# Patient Record
Sex: Male | Born: 1990
Health system: Southern US, Community
[De-identification: ages and names within clinical notes are randomized; demographics above are authoritative.]

## PROBLEM LIST (undated history)

## (undated) DIAGNOSIS — L0501 Pilonidal cyst with abscess: Secondary | ICD-10-CM

## (undated) DIAGNOSIS — Z87898 Personal history of other specified conditions: Secondary | ICD-10-CM

## (undated) DIAGNOSIS — I456 Pre-excitation syndrome: Secondary | ICD-10-CM

## (undated) DIAGNOSIS — T7840XA Allergy, unspecified, initial encounter: Secondary | ICD-10-CM

## (undated) DIAGNOSIS — F909 Attention-deficit hyperactivity disorder, unspecified type: Secondary | ICD-10-CM

## (undated) DIAGNOSIS — E119 Type 2 diabetes mellitus without complications: Secondary | ICD-10-CM

## (undated) HISTORY — DX: Personal history of other specified conditions: Z87.898

## (undated) HISTORY — DX: Pilonidal cyst with abscess: L05.01

## (undated) HISTORY — DX: Type 2 diabetes mellitus without complications: E11.9

## (undated) HISTORY — DX: Allergy, unspecified, initial encounter: T78.40XA

## (undated) HISTORY — DX: Pre-excitation syndrome: I45.6

## (undated) HISTORY — DX: Attention-deficit hyperactivity disorder, unspecified type: F90.9

---
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
## 2009-02-08 ENCOUNTER — Emergency Department (HOSPITAL_COMMUNITY): Admission: EM | Admit: 2009-02-08 | Discharge: 2009-02-08 | Payer: Self-pay | Admitting: Emergency Medicine

## 2009-03-16 ENCOUNTER — Ambulatory Visit: Payer: Self-pay | Admitting: Internal Medicine

## 2009-03-31 ENCOUNTER — Encounter: Payer: Self-pay | Admitting: Internal Medicine

## 2009-04-01 ENCOUNTER — Encounter: Payer: Self-pay | Admitting: *Deleted

## 2009-04-20 ENCOUNTER — Telehealth (INDEPENDENT_AMBULATORY_CARE_PROVIDER_SITE_OTHER): Payer: Self-pay | Admitting: *Deleted

## 2009-04-20 ENCOUNTER — Telehealth: Payer: Self-pay | Admitting: Internal Medicine

## 2009-04-21 ENCOUNTER — Encounter: Payer: Self-pay | Admitting: Internal Medicine

## 2009-05-11 ENCOUNTER — Ambulatory Visit: Payer: Self-pay | Admitting: Internal Medicine

## 2009-05-21 ENCOUNTER — Telehealth: Payer: Self-pay | Admitting: Internal Medicine

## 2009-10-22 ENCOUNTER — Ambulatory Visit: Payer: Self-pay | Admitting: Internal Medicine

## 2010-02-28 ENCOUNTER — Ambulatory Visit: Payer: Self-pay | Admitting: Internal Medicine

## 2010-02-28 DIAGNOSIS — R209 Unspecified disturbances of skin sensation: Secondary | ICD-10-CM | POA: Insufficient documentation

## 2010-12-15 ENCOUNTER — Ambulatory Visit
Admission: RE | Admit: 2010-12-15 | Discharge: 2010-12-15 | Payer: Self-pay | Source: Home / Self Care | Attending: Internal Medicine | Admitting: Internal Medicine

## 2010-12-28 ENCOUNTER — Telehealth: Payer: Self-pay | Admitting: Internal Medicine

## 2011-01-12 NOTE — Progress Notes (Signed)
Summary: Side Effects on Strattera  Phone Note Call from Patient Call back at [phone]   Caller: Patient Summary of Call: Pt called saying that he wants to discuss Straterra. I spoke to pt and he took 1 pill and he was fatigue and felt druged up. Pt doesn't want to take another rx. Pt wants to try Vyvanse or Adderall. Initial call taken by: Romualdo Bolk, CMA (AAMA),  December 28, 2010 3:24 PM  Follow-up for Phone Call        Pt called back and wants to know status of med req change. Pls call back asap.  Follow-up by: Lucy Antigua,  December 29, 2010 3:01 PM  Additional Follow-up for Phone Call Additional follow up Details #1::        I think it is ok to try vyvanse   I had a note to Dr Graciela Husbands but Mickle Plumb had a full answer yet.  but I think ok to do.I can write an rx   vyvanse  20 mg 1 by mouth once daily and with coupon and then rov  in 3-4 weeks on med  Additional Follow-up by: Madelin Headings MD,  December 30, 2010 12:57 PM    Additional Follow-up for Phone Call Additional follow up Details #2::    pt aware rx up front ready for p/u, pt aware Follow-up by: Alfred Levins, CMA,  December 30, 2010 5:10 PM  New/Updated Medications: VYVANSE 20 MG CAPS (LISDEXAMFETAMINE DIMESYLATE) 1 by mouth once daily Prescriptions: VYVANSE 20 MG CAPS (LISDEXAMFETAMINE DIMESYLATE) 1 by mouth once daily  #30 x 0   Entered by:   Madelin Headings MD   Authorized by:   Romualdo Bolk, CMA (AAMA)   Signed by:   Madelin Headings MD on 12/30/2010   Method used:   Print then Give to Patient   RxID:   [id number]

## 2011-01-12 NOTE — Assessment & Plan Note (Signed)
Summary: D/C STRATTERA WOULD LIKE TO RESUME TAKING//SLM   Vital Signs:  Patient profile:   20 year old male Height:      69 inches Weight:      141 pounds BMI:     20.90 Pulse rate:   66 / minute BP sitting:   130 / 80  (right arm) Cuff size:   regular  Vitals Entered By: Romualdo Bolk, CMA (AAMA) (December 15, 2010 8:46 AM) CC: Discuss going back on strattera   History of Present Illness: Andrew Ruiz  comes in today   for problem with . his adhd more problematic now in school  taking calculus ths semester  . working and schoolhard to study at home.    tying  to get 8 hour. sleep . Had a good effect from starterra in the past and interested in restarting.   No cv signs  . Dr Peggyann Shoals dc preexcitation but not WPW.    Hard time concentrating  later in pm and extraneous  stimuli is problmatc.   Preventive Screening-Counseling & Management  Alcohol-Tobacco     Alcohol drinks/day: 0     Smoking Status: never     Passive Smoke Exposure: no  Caffeine-Diet-Exercise     Caffeine use/day: 3-4     Diet Comments: all four food groups, picky eater, good appetite     Does Patient Exercise: no  Current Medications (verified): 1)  None  Allergies (verified): No Known Drug Allergies  Past History:  Past medical, surgical, family and social histories (including risk factors) reviewed, and no changes noted (except as noted below).  Past Medical History: Reviewed history from 04/28/2009 and no changes required. ADHD  last eval 5/08   [facility]  [facility]  Allergic rhinitis   Abnormal ekg   ? preexcitation but no WPW and  neg cards eval. Ventricular preexcitation.  Chest pain spells.  Question associated with palpitations.  Borderline blood pressure, though last time when he saw you in your       office, it was clearly normal.   Past Surgical History: Reviewed history from 07/30/2007 and no changes required. Denies surgical history  Past History:  Care Management: None  Current  Family History: Reviewed history from 08/05/2008 and no changes required. adhd Father: Healthy Mother: Healthy Siblings: ADHD  Social History: Reviewed history from 04/28/2009 and no changes required. Single hhof 4  pets cats dogs and birds. secondsemester sophomore   biology. at [employer] and works in pm. works at [employer].   2-6  every day  neg tad   Review of Systems       neg cv pulm gi or gu.   had sore spot after falling on backside while wrestling  no redness or   discharge    Physical Exam  General:  Well-developed,well-nourished,in no acute distress; alert,appropriate and cooperative throughout examination Head:  normocephalic and atraumatic.   Eyes:  vision grossly intact, pupils equal, and pupils round.   Ears:  R ear normal, L ear normal, and no external deformities.   Nose:  no external deformity, no external erythema, and no nasal discharge.   Neck:  No deformities, masses, or tenderness noted. Lungs:  Normal respiratory effort, chest expands symmetrically. Lungs are clear to auscultation, no crackles or wheezes. Heart:  Normal rate and regular rhythm. S1 and S2 normal without gallop, murmur, click, rub or other extra sounds.no lifts.   Abdomen:  Bowel sounds positive,abdomen soft and non-tender without  masses, organomegaly or hernias noted. Pulses:  pulses intact without delay   Extremities:  no clubbing cyanosis or edema  Neurologic:  non focal  Skin:  turgor normal, color normal, no ecchymoses, and no petechiae.   Cervical Nodes:  No lymphadenopathy noted Psych:  Oriented X3, normally interactive, good eye contact, not anxious appearing, and not depressed appearing.     Impression & Recommendations:  Problem # 1:  ADHD (ICD-314.01) problematic now in school   . had some helps with straterra  although adderall was good but cuased amorexia.    now would do better with  medication addition.    no alarm signs and otherwise well.  consider vyvanse if  needed   will flag Dr Kirtland Bouchard cards about  risk benefit   of med.   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    has seen Dr Graciela Husbands in the past.   Complete Medication List: 1)  Strattera 60 Mg Caps (Atomoxetine hcl) .[name].. 1 by mouth once daily  or as directed  Other Orders: Flu Vaccine 63yrs + ([address]) Admin 1st Vaccine (60454) samples of 40 mg straterra given  20  to begin  Patient Instructions: 1)  take 40 mg per day for a week thn increase to 60 mg per day  after  1-2 weeks we may increase to 80 mg if needed. 2)  call after  2-3 weeks and we can decide on  dosing  3)  return office visit in about a month. 4)  I will contact Dr Graciela Husbands about poss of using stimulant meds again   in case this works better. Prescriptions: STRATTERA 60 MG CAPS (ATOMOXETINE HCL) 1 by mouth once daily  or as directed  #30 x 1   Entered and Authorized by:   Madelin Headings MD   Signed by:   Madelin Headings MD on 12/15/2010   Method used:   Print then Give to Patient   RxID:   [id number]    Orders Added: 1)  Flu Vaccine 85yrs + [30865] 2)  Admin 1st Vaccine [90471] 3)  Est. Patient Level IV [78469]   Immunizations Administered:  Influenza Vaccine # 1:    Vaccine Type: Fluvax 3+    Site: left deltoid    Mfr: Sanofi Pasteur    Dose: 0.5 ml    Route: IM    Given by: Romualdo Bolk, CMA (AAMA)    Exp. Date: 06/10/2011    Lot #: [id number]  Flu Vaccine Consent Questions:    Do you have a history of severe allergic reactions to this vaccine? no    Any prior history of allergic reactions to egg and/or gelatin? no    Do you have a sensitivity to the preservative Thimersol? no    Do you have a past history of Guillan-Barre Syndrome? no    Do you currently have an acute febrile illness? no    Have you ever had a severe reaction to latex? no    Vaccine information given and explained to patient? yes   Immunizations Administered:  Influenza Vaccine # 1:    Vaccine  Type: Fluvax 3+    Site: left deltoid    Mfr: Sanofi Pasteur    Dose: 0.5 ml    Route: IM    Given by: Romualdo Bolk, CMA (AAMA)    Exp. Date: 06/10/2011    Lot #: [id number]

## 2011-01-12 NOTE — Assessment & Plan Note (Signed)
Summary: hand pain//ccm   Vital Signs:  Patient profile:   20 year old male Height:      69 inches Weight:      192 pounds BMI:     28.46 Pulse rate:   78 / minute BP sitting:   124 / 70  (right arm) Cuff size:   regular  Vitals Entered By: Romualdo Bolk, CMA (AAMA) (February 28, 2010 2:17 PM) CC: Pt states that it is hard to do small things. They have gone numb at times. This has been going on for 2 weeks. No injury. Pt also has a spot on his upper right back that has been bothering him as well in the muscle as well.    History of Present Illness: Andrew Ruiz comesin today with male companion   for above. Above problem    Began 2 weeks ago .  Onset at work  when  hands  and had gone numb  .  No hx of same.   No problem with pain usually .  No weakness  poss harder to move.    No nocturnal symptom . no change   Right handed . Waits at [employer] for over a year and had been painting a room recently for .(  moving .  )Writing papers for school but not unusual amount. No recent gym work.   No systemic symptom   Preventive Screening-Counseling & Management  Alcohol-Tobacco     Alcohol drinks/day: 0     Smoking Status: never     Passive Smoke Exposure: no  Caffeine-Diet-Exercise     Caffeine use/day: 3-4     Diet Comments: all four food groups, picky eater, good appetite     Does Patient Exercise: no  Current Medications (verified): 1)  None  Allergies (verified): No Known Drug Allergies  Past History:  Care Management: None Current  Social History: Smoking Status:  never Does Patient Exercise:  no Caffeine use/day:  3-4  Review of Systems  The patient denies anorexia, fever, vision loss, decreased hearing, hoarseness, chest pain, syncope, dyspnea on exertion, peripheral edema, prolonged cough, headaches, hemoptysis, abdominal pain, transient blindness, difficulty walking, depression, abnormal bleeding, enlarged lymph nodes, and angioedema.         no LE weakness  or numbness   Physical Exam  General:  Well-developed,well-nourished,in no acute distress; alert,appropriate and cooperative throughout examination Head:  normocephalic and atraumatic.   Eyes:  vision grossly intact, pupils equal, and pupils round.   Neck:  No deformities, masses, or tenderness noted. Lungs:  no intercostal retractions and no accessory muscle use.   Heart:  normal rate, regular rhythm, and no murmur.   Abdomen:  Bowel sounds positive,abdomen soft and non-tender without masses, organomegaly or  noted. Msk:  normal ROM, no joint swelling, no joint warmth, no redness over joints, and no joint deformities.   grip 5/5  no atrophy seen  no tremor   right periscapular area with / muscle spasm no mass.   no bony tenderness.  Pulses:  pulses intact without delay   Extremities:  no clubbing cyanosis or edema  Neurologic:  alert & oriented X3, sensation intact to light touch, gait normal, and DTRs symmetrical and normal.   Skin:  turgor normal, color normal, and no ecchymoses.   Cervical Nodes:  No lymphadenopathy noted Psych:  Oriented X3, not anxious appearing, and not depressed appearing.     Impression & Recommendations:  Problem # 1:  TINGLING (ICD-782.0) decribed in hands but  not in a given dermatome.   normal exam . / if overuse  related .  will follow for alarm signs and refer as appropriate.   Patient Instructions: 1)  Tthis acts like   nerve irritation  poss from overuse  repetitive motion However  if not improving in another 2 weeks call and we will get a consult . and more evaluation. 2)  In the  meantime consider wrist supports ( not rigid) while   working and [employer] two times a day for a week or 2 . 3)  Call in meantime if  progressive changes or any other concerns.

## 2011-02-07 ENCOUNTER — Encounter: Payer: Self-pay | Admitting: Internal Medicine

## 2011-02-07 ENCOUNTER — Ambulatory Visit (INDEPENDENT_AMBULATORY_CARE_PROVIDER_SITE_OTHER): Payer: 59 | Admitting: Internal Medicine

## 2011-02-07 VITALS — BP 120/80 | HR 60 | Wt 193.0 lb

## 2011-02-07 DIAGNOSIS — F909 Attention-deficit hyperactivity disorder, unspecified type: Secondary | ICD-10-CM

## 2011-02-07 DIAGNOSIS — L0501 Pilonidal cyst with abscess: Secondary | ICD-10-CM

## 2011-02-07 DIAGNOSIS — R9431 Abnormal electrocardiogram [ECG] [EKG]: Secondary | ICD-10-CM | POA: Insufficient documentation

## 2011-02-07 MED ORDER — LISDEXAMFETAMINE DIMESYLATE 20 MG PO CAPS: 20.0000 mg | ORAL_CAPSULE | ORAL | Status: AC

## 2011-02-07 NOTE — Progress Notes (Signed)
  Subjective:    Patient ID: Andrew Ruiz, male    DOB: 1991-09-21, 21 y.o.   MRN: [id number]  HPI  patient comes in today for 2 problems  #1.  Cyst pilonidal. Had some increasing swelling pain and redness in this area and went to see background urgent care where the area was drained and he was put on an antibiotic doxycycline. He was then told to followup at his primary doctor. Since then he has had decreasing pain a little bit of drainage and decreasing swelling. Problem  #2.   ADHD   He had a significant side effect when starting  Strattera this time with drowsiness After review with Dr. Graciela Husbands we had  Prescribed     vyvanse 20 mg a day but he has not picked up not  prescription written in January.    He currently is going to school 14 credit hours and working from 2 to 6 in the evening. He is attending an ADHD group on campus. He feels that medication would help as it has in his past.  his sibling is on  Vyvanse  And doing well .  Past Medical History  Diagnosis Date  . ADHD (attention deficit hyperactivity disorder)     [facility]  . Allergy   . Ventricular pre-excitation    No past surgical history on file.  reports that he has never smoked. He does not have any smokeless tobacco history on file. He reports that he drinks alcohol. He reports that he does not use illicit drugs. family history includes ADD / ADHD in his brother.     Review of Systems  no cardiovascular or pulmonary symptoms.    Objective:   Physical Exam  well-developed well-nourished in no acute distress HEENT grossly normal. Neck supple without masses thyromegaly or bruit chest CTA BX equal cardiac S1-S2 no gallops murmurs.  Skin:   Small incisional hole in the pilonidal area expressed very minimal amount of serous discharge.   No redness tenderness or fluctuants.  Neurologic:   No focal deficits no tremor oriented x3 cognition and speech normal.       Assessment & Plan:   pilonidal cyst  status post incision and drainage appears to be on its way 2 resolution  2 continue antibiotic hot compresses. Discussed natural history that this can return. Call if recurrent.   ADHD:   Somewhat problematic as he is in college and I believe would be significantly helped by medication. He had a side effect with the Strattera of great drowsiness. We'll follow closely and on a stimulant medication. His EKG shows preexcitation but he does not have risk otherwise with previous evaluation   By Dr. Graciela Husbands.     prescription rewritten with coupon given and plan follow up in month. Suspect he may need an increased dose of medication at that time he can call in meantime if needed.

## 2011-02-07 NOTE — Patient Instructions (Signed)
Continue antibioitc and hot compresses  . Call if recurring. Begin vyvanse as we discussed.   return office visit in a month or call . We may need to increase dose for most efficincy.

## 2011-03-07 ENCOUNTER — Ambulatory Visit: Payer: 59 | Admitting: Internal Medicine

## 2011-03-13 ENCOUNTER — Ambulatory Visit: Payer: 59 | Admitting: Internal Medicine

## 2011-04-25 NOTE — Letter (Signed)
October 02, 2007    Neta Mends. Fabian Sharp, MD  [address] [place], [place] [address]   RE:  WARNER, LADUCA  MRN:  [id number]  /  DOB:  1991/03/08   Dear Burna Mortimer:   It was a pleasure to talk to you today, and to see you patient, Andrew Ruiz, in consultation because of ventricular preexcitation.   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  They may or may not be  associated with palpitations.  There is some nausea, diaphoresis, and  dyspnea.  These last a couple of minutes.   Because of these episodes, you undertook electrocardiogram, which  demonstrated ventricular preexcitation.  As noted, there has been no  specific complaints related to palpitations, and no syncope.   There is no family history.   The intake sheet filled out by his mom describes a rapid heart rate  usually once a month or so, though on clarification, as mentioned,  there has been no specific description of palpitations.   PAST MEDICAL HISTORY:  Notable for ADHD for which he takes Strattera.   PAST SURGICAL HISTORY:  Notable for some intestinal surgery when he was  a wee lad of the age of 48.   He does not use cigarettes, alcohol, or recreational drugs.  He is quite  fit.  He participates in [employer], wrestling, and weight lifting.  He  is somewhat discouraged today because he may not be able to wrestle 152,  as he found out he weighs 173.   EXAMINATION:  Otherwise, on examination his blood pressure was  borderline at 140/86.  His pulse was 104.  His weight was 173.  HEENT EXAM:  Demonstrated no icterus or xanthoma.  The neck veins were flat.  The carotids were brisk and full bilaterally  without bruits.  BACK:  Was without kyphosis or scoliosis.  LUNGS:  Were clear.  HEART:  Sounds were regular without murmurs or gallops.  ABDOMEN:  Soft with active bowel sounds without midline pulsation or  hepatomegaly.  Femoral pulses  were 2+.  Distal pulses were intact.  There was no  clubbing, cyanosis, or edema.  NEUROLOGIC EXAM:  Was grossly normal.  SKIN:  Was warm and dry.   Electrocardiogram from your office, and not repeated today, demonstrated  sinus rhythm at 63 with ventricular preexcitation.  The deltoids were  prominently positive inferiorly.  Transition occurred in lead G-4.  They  were negative in aVR and aVL.   IMPRESSION:  1. Ventricular preexcitation.  2. Chest pain spells.  Question associated with palpitations.  3. Borderline blood pressure, though last time when he saw you in your      office, it was clearly normal.  4. Attention deficit hyperactive disorder.  On stimulants.   Hanson, Medeiros has ventricular preexcitation.  It is not clear whether he  has the WPW syndrome.  We will use an event recorder to clarify whether  these spells are associated with arrhythmia or not.   Echocardiography and treadmill testing are indicated.  The former to  assess for structural heart disease, the latter to see what happens to  his delta wave with exercise.  Rarely, patients can be stratified to low  risk by the loss of delta waves abruptly during exercise.   During this process of his evaluation, the question of participation has  arisen.  I think his risks for sudden  cardiac death are very low.  I  should note that he and his mother and I had a lengthy discussion  regarding the physiology, as well as potential risks associated with  atrial fibrillation and rapid ventricular rates.  While he currently  appears to be low risk, I suggested that while we are evaluating him,  participation in sports in a venue which has approximate AED is a  reasonable compromise.   We will forward the results of the aforementioned tests to you as it  comes back.   Thanks again for asking [place] to participate in his care.    Sincerely,      Duke Salvia, MD, [facility]  Electronically Signed    SCK/MedQ  DD:  10/02/2007  DT: 10/03/2007  Job #: [id number]

## 2011-04-25 NOTE — Letter (Signed)
October 23, 2007    Neta Mends. Fabian Sharp, MD  [address] [place], [place] [address]   RE:  BRYCESON, GRAPE  MRN:  [id number]  /  DOB:  1991/10/29   Dear Burna Mortimer:   Teola Bradley comes in today following his event recorder.  As you recall,  his exercise test was normal, his echocardiogram turned out to be  normal, and interestingly his electrocardiogram today shows intermittent  preexcitation.  He had some symptoms which we looked at on his monitor.  These were associated with occasional PACs and sinus arrhythmia.   He has not been taking his Strattera.   At this point I think it is reasonable for him to return to his sports.  There is no evidence that he has had tachycardia, although we will  continue to monitor him as he goes back on his stimulants.  Given his  intermittent preexcitation, he should be extremely low risk for sudden  death related to antegrade conduction over an accessory pathway.   We will plan to see him again in 3 months' time.    Sincerely,      Duke Salvia, MD, [facility]  Electronically Signed    SCK/MedQ  DD: 10/23/2007  DT: 10/24/2007  Job #: [id number]

## 2011-06-29 ENCOUNTER — Encounter: Payer: Self-pay | Admitting: Internal Medicine

## 2011-06-29 ENCOUNTER — Ambulatory Visit (INDEPENDENT_AMBULATORY_CARE_PROVIDER_SITE_OTHER): Payer: 59 | Admitting: Internal Medicine

## 2011-06-29 VITALS — BP 120/80 | HR 66 | Ht 69.0 in | Wt 200.0 lb

## 2011-06-29 DIAGNOSIS — F909 Attention-deficit hyperactivity disorder, unspecified type: Secondary | ICD-10-CM

## 2011-06-29 DIAGNOSIS — T23209A Burn of second degree of unspecified hand, unspecified site, initial encounter: Secondary | ICD-10-CM | POA: Insufficient documentation

## 2011-06-29 DIAGNOSIS — R079 Chest pain, unspecified: Secondary | ICD-10-CM | POA: Insufficient documentation

## 2011-06-29 MED ORDER — LISDEXAMFETAMINE DIMESYLATE 20 MG PO CAPS: 20.0000 mg | ORAL_CAPSULE | ORAL | Status: AC
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
## 2011-08-03 ENCOUNTER — Telehealth: Payer: Self-pay | Admitting: *Deleted

## 2011-08-03 NOTE — Telephone Encounter (Signed)
Pt would like to have something called in for Restless Leg Syndrome.

## 2011-08-07 NOTE — Telephone Encounter (Signed)
Left message to call back. Need more info.

## 2011-08-15 NOTE — Telephone Encounter (Signed)
Pt never returned our call

## 2011-08-27 ENCOUNTER — Emergency Department (HOSPITAL_COMMUNITY)
Admission: EM | Admit: 2011-08-27 | Discharge: 2011-08-27 | Disposition: A | Discharge status: Home / Self Care | Payer: 59 | Attending: Emergency Medicine | Admitting: Emergency Medicine

## 2011-08-27 DIAGNOSIS — R000 Tachycardia, unspecified: Secondary | ICD-10-CM | POA: Insufficient documentation

## 2011-08-27 DIAGNOSIS — L0501 Pilonidal cyst with abscess: Secondary | ICD-10-CM | POA: Insufficient documentation

## 2011-08-28 ENCOUNTER — Ambulatory Visit (INDEPENDENT_AMBULATORY_CARE_PROVIDER_SITE_OTHER): Payer: 59 | Admitting: General Surgery

## 2011-08-28 ENCOUNTER — Encounter: Payer: Self-pay | Admitting: Internal Medicine

## 2011-08-28 ENCOUNTER — Encounter (INDEPENDENT_AMBULATORY_CARE_PROVIDER_SITE_OTHER): Payer: Self-pay | Admitting: General Surgery

## 2011-08-28 VITALS — BP 120/70 | HR 60 | Temp 97.3°F | Ht 69.0 in | Wt 199.0 lb

## 2011-08-28 DIAGNOSIS — L0501 Pilonidal cyst with abscess: Secondary | ICD-10-CM

## 2011-08-28 HISTORY — DX: Pilonidal cyst with abscess: L05.01

## 2011-08-28 NOTE — Progress Notes (Signed)
Subjective:     Patient ID: Andrew Ruiz, male   DOB: May 08, 1991, 20 y.o.   MRN: [id number]  HPI This is a 20 year old male who is otherwise healthy. He presents with a history of about 7 months ago having some swelling in the upper portion of the gluteal cleft. This resolved on its own at that time. On Thursday night he had a recurrence of this as well as a fever. He was evaluated in urgent care where he was noted to have a large painful swollen pilonidal abscess. He underwent drainage at that time but did not have this packed. He was placed on antibiotics at that point. This area recurred requiring him to be seen in the emergency room on Sunday morning where he had this drained and packed. Since then he has had no fevers and is doing better. He comes in today to have this evaluated.  Review of Systems     Objective:   Physical Exam He has an incision in the midline that is open and packed.  He has brownish drainage on his packing    Assessment:     S/p pilonidal abscess drainage by ER    Plan:        We discussed the etiology of pilonidal disease. I repacked this. I will have him return on Thursday to be packed again. Hopefully at that point we packed this and have him followup next week and stop packing. He has no surrounding areas of infection. I think that he likely can be strip shaved over the long-term and hopefully avoid any surgery.

## 2011-08-31 ENCOUNTER — Encounter (INDEPENDENT_AMBULATORY_CARE_PROVIDER_SITE_OTHER): Payer: Self-pay

## 2012-01-22 ENCOUNTER — Ambulatory Visit (INDEPENDENT_AMBULATORY_CARE_PROVIDER_SITE_OTHER): Payer: 59 | Admitting: Internal Medicine

## 2012-01-22 ENCOUNTER — Encounter: Payer: Self-pay | Admitting: Internal Medicine

## 2012-01-22 VITALS — BP 120/80 | HR 103 | Temp 98.1°F | Wt 191.0 lb

## 2012-01-22 DIAGNOSIS — J22 Unspecified acute lower respiratory infection: Secondary | ICD-10-CM

## 2012-01-22 DIAGNOSIS — J988 Other specified respiratory disorders: Secondary | ICD-10-CM

## 2012-01-22 DIAGNOSIS — J329 Chronic sinusitis, unspecified: Secondary | ICD-10-CM

## 2012-01-22 NOTE — Patient Instructions (Signed)
This is a viral respiratory infection and should resolve  On its own Cough may get worse before getting better. Most sinus infection resolve in 7- 10 days with decongestants and time.   Begin  pseudophedrine decongestant in day  ( may keep you up at night but ok to take then)    Afrin nose spray each night  To decongest for 3 nights.  If face pressure is not relieved in  3 days then call  For advice . Otherwise as needed.

## 2012-01-22 NOTE — Progress Notes (Signed)
  Subjective:    Patient ID: Andrew Ruiz, male    DOB: 05-20-1991, 21 y.o.   MRN: [id number]  HPI Patient comes in today for SDA  For acute problem evaluation. Since 3 days ago onset of achiness and ur congstion and some cough face is full o pressure but no heaache  Unsure what to take   Last pm   No flu shot.    No sob or wheezing.    Everyone else sick .   Benadryl last pm.  Using salin irrigation  GF in ICU in [place]   And passed away last week then Mom had  abd pain ? gb disease.  Under evaluation.  Missed school last week  Now sick . No cp sob     Review of Systems As per hpi no rash syncope chills current fever .  Past history family history social history reviewed in the electronic medical record.     Objective:   Physical Exam WDWN in NAD  quiet respirations; mildly congested  somewhat hoarse. Non toxic . Tired appearig HEENT: Normocephalic ;atraumatic , Eyes;  PERRL, EOMs  Full, lids and conjunctiva clear,,Ears: no deformities, canals nl, TM landmarks normal, Nose: no deformity extremely  decongested  face minimally tender Mouth : OP clear without lesion or edema . Neck: Supple without adenopathy or masses or bruits Chest:  Clear to A&P without wheezes rales or rhonchi CV:  S1-S2 no gallops or murmurs peripheral perfusion is normal Skin :nl perfusion and no acute rashes       Assessment & Plan:  Acute uri with sinus congestion prob  Viral  At present continue irrigation add decongestants therapy  And call if not improving in another 3- days or as needed consider adding antibiotic if appropriate.  Note for school

## 2012-05-23 ENCOUNTER — Ambulatory Visit: Payer: Self-pay

## 2012-05-23 ENCOUNTER — Encounter: Payer: Self-pay | Admitting: Internal Medicine

## 2012-05-23 ENCOUNTER — Ambulatory Visit (INDEPENDENT_AMBULATORY_CARE_PROVIDER_SITE_OTHER): Payer: 59 | Admitting: Internal Medicine

## 2012-05-23 VITALS — BP 116/70 | HR 68 | Temp 98.7°F | Ht 68.75 in | Wt 188.0 lb

## 2012-05-23 DIAGNOSIS — Z299 Encounter for prophylactic measures, unspecified: Secondary | ICD-10-CM

## 2012-05-23 DIAGNOSIS — L259 Unspecified contact dermatitis, unspecified cause: Secondary | ICD-10-CM

## 2012-05-23 MED ORDER — FLUOCINONIDE-E 0.05 % EX CREA: TOPICAL_CREAM | Freq: Two times a day (BID) | CUTANEOUS | Status: AC

## 2012-05-23 NOTE — Progress Notes (Signed)
  Subjective:    Patient ID: Andrew Ruiz, male    DOB: 1991-01-17, 21 y.o.   MRN: [id number]  HPI Patient comes in today for SDA. 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 This will include significant physical activity he also wants to know his PMI because it needs to be under 32 qualify. He brought he can run 5 miles no chest pain shortness of breath tries deep healthy.  He also has a new rash on is left lower extremities after mowing the rest. At this time and is now very itchy. He does have a sensitivity to poison ivy.   Review of Systems For chest pain shortness of breath syncope change in health status.  Past history family history social history reviewed in the electronic medical record. No meds now    Objective:   Physical Exam BP 116/70  Pulse 68  Temp 98.7 F (37.1 C) (Oral)  Ht 5' 8.75" (1.746 m)  Wt 188 lb (85.276 kg)  BMI 27.96 kg/m2  SpO2 98% Well-developed well-nourished in no acute distress chest clear to auscultation cardiac S1-S2 no gallops murmurs negative CCE. Skin left lower extremity medial calf there is a blotchy papular red rash with no vesicles looks like an early contact dermatitis.    Assessment & Plan:  Form completed; clearance for exercise test. No contraindications is healthy discussed BMI.   Contact dermatitis probable early prescription for strong steroid given to patient to the if needed.  BMI in the 27 range other is healthy did discuss eating he is physically fit at this time.

## 2012-05-23 NOTE — Patient Instructions (Addendum)
Can use topical steroid if getting itchin of rash poss poison ivy.  No restrictions  Of activity .

## 2012-08-26 ENCOUNTER — Other Ambulatory Visit: Payer: Self-pay | Admitting: Occupational Medicine

## 2012-08-26 ENCOUNTER — Ambulatory Visit
Admission: RE | Admit: 2012-08-26 | Discharge: 2012-08-26 | Disposition: A | Discharge status: Home / Self Care | Payer: No Typology Code available for payment source | Source: Ambulatory Visit | Attending: Occupational Medicine | Admitting: Occupational Medicine

## 2012-08-26 DIAGNOSIS — Z021 Encounter for pre-employment examination: Secondary | ICD-10-CM

## 2013-02-06 ENCOUNTER — Ambulatory Visit (INDEPENDENT_AMBULATORY_CARE_PROVIDER_SITE_OTHER): Payer: 59 | Admitting: Family Medicine

## 2013-02-06 ENCOUNTER — Encounter: Payer: Self-pay | Admitting: Family Medicine

## 2013-02-06 VITALS — BP 118/78 | HR 70 | Temp 98.2°F | Wt 199.0 lb

## 2013-02-06 DIAGNOSIS — T148XXA Other injury of unspecified body region, initial encounter: Secondary | ICD-10-CM

## 2013-02-06 NOTE — Patient Instructions (Addendum)
-  heat for 15 minutes twice daily  -topical sports creams for pain  -can use ibuprofen 400-600mg  twice daily or tylenol 500-1000mg  twice daily for pain if needed  -do home exercises provided 1) start with stretching (circled) exercises for the first week 2) then add other exercises  -follow up in 4 weeks

## 2013-02-06 NOTE — Progress Notes (Signed)
Chief Complaint  Patient presents with  . 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  Past Medical History  Diagnosis Date  . ADHD (attention deficit hyperactivity disorder)     [facility]  . Allergy   . Ventricular pre-excitation     on ekg card eval  no restrictions  . History of precordial chest pain   . Pilonidal abscess     Family History  Problem Relation Age of Onset  . ADD / ADHD Brother     History   Social History  . Marital Status: Single    Spouse Name: N/A    Number of Children: N/A  . Years of Education: N/A   Social History Main Topics  . Smoking status: Never Smoker   . Smokeless tobacco: None  . Alcohol Use: No  . Drug Use: No  . Sexually Active: None   Other Topics Concern  . None   Social History Narrative   ** Merged History Encounter **       HH of 4   Pets Cats,dogs and bird      Was at [employer]. 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 Not on face for PI, Disp: 30 g, Rfl: 0  EXAM:  Filed Vitals:   02/06/13 1611  BP: 118/78  Pulse: 70  Temp: 98.2 F (36.8 C)    Body mass index is 29.61 kg/(m^2).  GENERAL: vitals reviewed and listed above, alert, oriented, appears well hydrated and in no acute distress  HEENT: atraumatic, conjunttiva clear, no obvious abnormalities on inspection  of external nose and ears  NECK: no obvious masses on inspection, no bony TTP, normal ROM  MS: moves all extremities without noticeable abnormality -normal strength and ROM upper ext bilat -no winging scapula, no bonny TTP -TTP soft tissue at attachment of LS to scapula on L, L trapezius muscle tension   PSYCH: pleasant and cooperative, no obvious depression or anxiety  ASSESSMENT AND PLAN:  Discussed the following assessment and plan:  Muscle strain  -Patient advised to return or notify a doctor immediately if symptoms worsen or persist or new concerns arise.  Patient Instructions  -heat for 15 minutes twice daily  -topical sports creams for pain  -can use ibuprofen 400-600mg  twice daily or tylenol 500-1000mg  twice daily for pain if needed  -do home exercises provided 1) start with stretching (circled) exercises for the first week 2) then add other exercises  -follow up in 4 weeks      KIM, HANNAH R.

## 2013-07-28 ENCOUNTER — Ambulatory Visit (INDEPENDENT_AMBULATORY_CARE_PROVIDER_SITE_OTHER): Payer: 59 | Admitting: Internal Medicine

## 2013-07-28 ENCOUNTER — Telehealth: Payer: Self-pay | Admitting: Family Medicine

## 2013-07-28 ENCOUNTER — Encounter: Payer: Self-pay | Admitting: Internal Medicine

## 2013-07-28 VITALS — BP 140/80 | HR 74 | Temp 97.9°F | Wt 166.0 lb

## 2013-07-28 DIAGNOSIS — R634 Abnormal weight loss: Secondary | ICD-10-CM

## 2013-07-28 DIAGNOSIS — R631 Polydipsia: Secondary | ICD-10-CM

## 2013-07-28 LAB — LIPID PANEL
Cholesterol: 206 mg/dL — ABNORMAL HIGH (ref 0–200)
HDL: 29.8 mg/dL — ABNORMAL LOW (ref >39.00)
Triglycerides: 899 mg/dL — ABNORMAL HIGH (ref 0.0–149.0)

## 2013-07-28 LAB — BASIC METABOLIC PANEL
CO2: 26 mEq/L (ref 19–32)
Calcium: 9.2 mg/dL (ref 8.4–10.5)
Chloride: 87 mEq/L — ABNORMAL LOW (ref 96–112)
Creatinine, Ser: 1 mg/dL (ref 0.4–1.5)
GFR: 93.65 mL/min (ref >60.00)
Glucose, Bld: 797 mg/dL (ref 70–99)
Sodium: 125 mEq/L — ABNORMAL LOW (ref 135–145)

## 2013-07-28 LAB — HEPATIC FUNCTION PANEL
ALT: 27 U/L (ref 0–53)
AST: 19 U/L (ref 0–37)
Albumin: 4.5 g/dL (ref 3.5–5.2)

## 2013-07-28 LAB — CBC WITH DIFFERENTIAL/PLATELET
Basophils Absolute: 0 10*3/uL (ref 0.0–0.1)
Eosinophils Absolute: 0.1 10*3/uL (ref 0.0–0.7)
HCT: 42.6 % (ref 39.0–52.0)
Hemoglobin: 14.9 g/dL (ref 13.0–17.0)
Lymphocytes Relative: 35 % (ref 12.0–46.0)
MCV: 86.8 fl (ref 78.0–100.0)
Neutrophils Relative %: 53.1 % (ref 43.0–77.0)
RBC: 4.91 Mil/uL (ref 4.22–5.81)

## 2013-07-28 LAB — POCT URINALYSIS DIP (MANUAL ENTRY)
Bilirubin, UA: NEGATIVE
Glucose, UA: 250
Leukocytes, UA: NEGATIVE
Nitrite, UA: NEGATIVE
Protein Ur, POC: NEGATIVE

## 2013-07-28 LAB — LDL CHOLESTEROL, DIRECT: Direct LDL: 69.2 mg/dL

## 2013-07-28 LAB — T4, FREE: Free T4: 1.25 ng/dL (ref 0.60–1.60)

## 2013-07-28 NOTE — Patient Instructions (Addendum)
Will notify you  of labs when available. Avoid  All  sugar drinks at this time.   Water is fine.  Plan follow visit depending on results.

## 2013-07-28 NOTE — Telephone Encounter (Signed)
Also tried reaching his mother (emergency contact).  Left a voice message for either her or him to return my call.

## 2013-07-28 NOTE — Telephone Encounter (Signed)
Received critical lab results from [facility].  Pt has glucose of 797.  WP reviewed other labs in the system.  Made a decision to send the pt to the hospital.  Tried reaching the patient by telephone.  Received a message that his voicemail box has not been set up yet.  Will try again at a later time.

## 2013-07-28 NOTE — Progress Notes (Signed)
Chief Complaint  Patient presents with  . Weight Loss    Thinks he has lost around 20 pounts.  Complains of always being thirsty.  First noticed the beginning of July.  . Polydipsia    HPI: Patient comes in today for SDA for  new problem evaluation. Last visit with me was 2 /13  Since July always has been thirsty this morning had Coke; milk and OJ and water.  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  Feels tired more than usual  And dry mouth   possibly blurry vision no diplopia no unusual infections syncope change in exercise tolerance but hasn't been that physically active. No reg exercise  Recently busy . Working Physicist, medical.  Recently [employer] first shift and now 3rd shift.  ROS: See pertinent positives and negatives per HPI. No chest pain shortness of breath coughing diarrhea vomiting significant abdominal pain. No unusual rashes. No tremor Negative family history diabetes positive family history of thyroid disease.  Past Medical History  Diagnosis Date  . ADHD (attention deficit hyperactivity disorder)     [facility] no [facility]  . Allergy   . Ventricular pre-excitation     on ekg card eval  no restrictions  . History of precordial chest pain   . Pilonidal abscess     Family History  Problem Relation Age of Onset  . ADD / ADHD Brother     History   Social History  . Marital Status: Single    Spouse Name: N/A    Number of Children: N/A  . Years of Education: N/A   Social History Main Topics  . Smoking status: Never Smoker   . Smokeless tobacco: None  . Alcohol Use: No  . Drug Use: No  . Sexual Activity: None   Other Topics Concern  . None   Social History Narrative   ** Merged History Encounter **       HH of 4   Pets Cats,dogs and bird      Was at [employer]. 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    Lives alone in an apartment    Outpatient Encounter Prescriptions as of 07/28/2013  Medication Sig Dispense Refill  . Melatonin 5 MG TABS Take by mouth. PRN Sleep 1-2 tabs       No facility-administered encounter medications on file as of 07/28/2013.    EXAM:  BP 140/80  Pulse 74  Temp(Src) 97.9 F (36.6 C) (Temporal)  Wt 166 lb (75.297 kg)  BMI 24.7 kg/m2  SpO2 98%  Body mass index is 24.7 kg/(m^2).  GENERAL: vitals reviewed and listed above, alert, oriented, appears well hydrated and in no acute distress skin turgor normal and nontoxic HEENT: atraumatic, conjunctiva  clear, no obvious abnormalities on inspection of external nose and ears OP : no lesion edema or exudate moist mucous membranes NECK: no obvious masses on inspection palpation no adenopathy LUNGS: clear to auscultation bilaterally, no wheezes, rales or rhonchi, good air movement CV: HRRR, no clubbing cyanosis or  peripheral edema nl cap refill no gallop or murmur Abdomen soft without organomegaly guarding or rebound MS: moves all extremities without noticeable focal  abnormality Neurologic grossly intact nonfocal no tremor DTRs present No adenopathy cervical axillary. PSYCH: pleasant and  cooperative, no obvious depression or anxiety Wt Readings from Last 3 Encounters:  07/28/13 166 lb (75.297 kg)  02/06/13 199 lb (90.266 kg)  05/23/12 188 lb (85.276 kg)    ASSESSMENT AND PLAN:  Discussed the following assessment and plan:  Loss of weight - Plan: Melatonin 5 MG TABS, Basic metabolic panel, CBC with Differential, Hemoglobin A1c, Hepatic function panel, Lipid panel, TSH, T4, free, T3, free, POCT urinalysis dipstick  Polydipsia - Plan: Melatonin 5 MG TABS, Basic metabolic panel, CBC with Differential, Hemoglobin A1c, Hepatic function panel, Lipid panel, TSH, T4, free, T3, free, POCT urinalysis dipstick Consider diabetes thyroid other metabolic close followup with results. Has appointment in 3 days for  followup. Currently patient exam is stable.   -Patient advised to return or notify health care team  if symptoms worsen or persist or new concerns arise.  Patient Instructions  Will notify you  of labs when available. Avoid  All  sugar drinks at this time.   Water is fine.  Plan follow visit depending on results.     Neta Mends. Panosh M.D.   Addendum I 5:15 PM. Notified by lab that his random blood sugar was in the 700s. With normal creatinine  Phone contact attempt to the patient ;has a mailbox that wasn't set up yet phone call by nurse to mother's emergency number left message to call us so we can get in touch with him. Fortunately he looks very clinically stable this morning. Based on numbers would have him see  emergency  for possible IV fluids although he is not acidotic and his BUN is only 13. and or be seen first thing in the morning.   Lab Results  Component Value Date   WBC 5.5 07/28/2013   HGB 14.9 07/28/2013   HCT 42.6 07/28/2013   PLT 256.0 07/28/2013   GLUCOSE 797 Repeated and verified X2.* 07/28/2013   CHOL 206* 07/28/2013   TRIG 899.0* 07/28/2013   HDL 29.80* 07/28/2013   LDLDIRECT 69.2 07/28/2013   ALT 27 07/28/2013   AST 19 07/28/2013   NA 125* 07/28/2013   K 4.5 07/28/2013   CL 87* 07/28/2013   CREATININE 1.0 07/28/2013   BUN 13 07/28/2013   CO2 26 07/28/2013   TSH 2.12 07/28/2013   HGBA1C 12.8* 07/28/2013

## 2013-07-29 ENCOUNTER — Encounter: Payer: Self-pay | Admitting: Endocrinology

## 2013-07-29 ENCOUNTER — Telehealth: Payer: Self-pay

## 2013-07-29 ENCOUNTER — Ambulatory Visit (INDEPENDENT_AMBULATORY_CARE_PROVIDER_SITE_OTHER): Payer: 59 | Admitting: Endocrinology

## 2013-07-29 ENCOUNTER — Encounter: Payer: Self-pay | Admitting: Internal Medicine

## 2013-07-29 ENCOUNTER — Ambulatory Visit (INDEPENDENT_AMBULATORY_CARE_PROVIDER_SITE_OTHER): Payer: 59 | Admitting: Internal Medicine

## 2013-07-29 VITALS — BP 116/78 | HR 74 | Ht 69.0 in | Wt 172.0 lb

## 2013-07-29 VITALS — BP 136/90 | HR 74 | Temp 97.6°F | Wt 172.0 lb

## 2013-07-29 DIAGNOSIS — E119 Type 2 diabetes mellitus without complications: Secondary | ICD-10-CM

## 2013-07-29 DIAGNOSIS — E1165 Type 2 diabetes mellitus with hyperglycemia: Secondary | ICD-10-CM

## 2013-07-29 DIAGNOSIS — IMO0001 Reserved for inherently not codable concepts without codable children: Secondary | ICD-10-CM

## 2013-07-29 LAB — GLUCOSE, POCT (MANUAL RESULT ENTRY): POC Glucose: 465 mg/dl — AB (ref 70–99)

## 2013-07-29 MED ORDER — GLUCOSE BLOOD VI STRP: 1.0000 | ORAL_STRIP | Freq: Four times a day (QID) | Status: DC

## 2013-07-29 NOTE — Telephone Encounter (Signed)
Pt's cbg in the office was 401 today

## 2013-07-29 NOTE — Telephone Encounter (Signed)
Tried reaching the patient.  No voicemail available.  WP would now like to see the patient in the office today.  Will continue to try to call.

## 2013-07-29 NOTE — Patient Instructions (Addendum)
good diet and exercise habits significanly improve the control of your diabetes.  please let me know if you wish to be referred to a dietician.  high blood sugar is very risky to your health.  you should see an eye doctor every year.  You are at higher than average risk for pneumonia and hepatitis-B.  You should be vaccinated against both.   controlling your blood pressure and cholesterol drastically reduces the damage diabetes does to your body.  this also applies to quitting smoking.  please discuss these with your doctor.  check your blood sugar 4 times a day--before the 3 meals, and at bedtime.  also check if you have symptoms of your blood sugar being too high or too low.  please keep a record of the readings and bring it to your next appointment here.  please call us sooner if your blood sugar goes below 70, or if you have a lot of readings over 200.   Please come back tomorrow.   For now, drink plenty of fluids.   Refer to a diabetes education specialist.  you will receive a phone call, about a day and time for an appointment.

## 2013-07-29 NOTE — Patient Instructions (Addendum)
6 units of short acting  . Insulin  For now and  See endocrinology this after noon.

## 2013-07-29 NOTE — Progress Notes (Signed)
Chief Complaint  Patient presents with  . Follow-up    HPI: Patient called in  For work in appt  Here with mom ;after some difficulty contacting him because of extremely high blood sugar in the 700 range. Seen yesterday for weight loss polyuria polydipsia full panel of lab work was done and was told to stop sugar drinks. He comes in today with his mother.  There may be family history of type 1 diabetes on maternal father's side. Had  Grain bar this am.  He feels somewhat better today drinking fluids water. 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 ROS: See pertinent positives and negatives per HPI. No syncope cp sob  .   Past Medical History  Diagnosis Date  . ADHD (attention deficit hyperactivity disorder)     [facility]  . Allergy   . Ventricular pre-excitation     on ekg card eval  no restrictions  . History of precordial chest pain   . Pilonidal abscess     Family History  Problem Relation Age of Onset  . ADD / ADHD Brother     History   Social History  . Marital Status: Single    Spouse Name: N/A    Number of Children: N/A  . Years of Education: N/A   Social History Main Topics  . Smoking status: Never Smoker   . Smokeless tobacco: None  . Alcohol Use: No  . Drug Use: No  . Sexual Activity: None   Other Topics Concern  . None   Social History Narrative   ** Merged History Encounter **       HH of 4   Pets Cats,dogs and bird      Was at [employer]. 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    Lives alone in an apartment    Outpatient Encounter Prescriptions as of 07/29/2013  Medication Sig Dispense Refill  . Melatonin 5 MG TABS Take by mouth. PRN Sleep 1-2 tabs       No facility-administered encounter medications on file as of 07/29/2013.   Maternal Fathers sister    Type  1 dm and mgf cousin.   Type 1 dm.     EXAM:  BP 136/90  Pulse 74  Temp(Src) 97.6 F (36.4 C) (Oral)  Wt 172 lb (78.019 kg)  BMI 25.59 kg/m2  SpO2 99%  Body mass index is 25.59 kg/(m^2). Wt Readings from Last 3 Encounters:  07/29/13 172 lb (78.019 kg)  07/28/13 166 lb (75.297 kg)  02/06/13 199 lb (90.266 kg)    GENERAL: vitals reviewed and listed above, alert, oriented, appears well hydrated and in no acute distress Vitals reviewed PSYCH: pleasant and cooperative, no obvious depression or anxiety Lab Results  Component Value Date   WBC 5.5 07/28/2013   HGB 14.9 07/28/2013   HCT 42.6 07/28/2013   PLT 256.0 07/28/2013   GLUCOSE 797 Repeated and verified X2.* 07/28/2013   CHOL 206* 07/28/2013   TRIG 899.0* 07/28/2013   HDL 29.80* 07/28/2013   LDLDIRECT 69.2 07/28/2013   ALT 27 07/28/2013   AST 19 07/28/2013   NA 125* 07/28/2013   K 4.5 07/28/2013   CL 87* 07/28/2013   CREATININE 1.0 07/28/2013   BUN 13 07/28/2013   CO2 26 07/28/2013   TSH 2.12 07/28/2013  HGBA1C 12.8* 07/28/2013    ASSESSMENT AND PLAN:  Discussed the following assessment and plan:  Uncontrolled diabetes mellitus new onset unspecified type - Plan: Ambulatory referral to Endocrinology  Type II or unspecified type diabetes mellitus without mention of complication, not stated as uncontrolled - Plan: POC Glucose (CBG) No  ketosis acidosis based on lab; hyponatremia probably secondary to the hyperglycemia dyslipidemia op course related apparently normal renal function.   Discussed situation glad he is feeling well and stable at this time hydration and insulin until blood sugar in reasonable range meter accucheck  given to patient after they called the insurance about what would be covered. 6 units of Humalog given in office has appointment made with Dr. Everardo All at 3:30 this afternoon they can recheck blood sugar that time to see what a low dose immediately release effect gives. Discussed possibility of basal insulin many other options.  We'll have to learn how  to check her blood sugar monitoring and management. We'll have her followup with me within 3-4 weeks to see where we are at Dr. 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  -Patient advised to return or notify health care team  if symptoms worsen or persist or new concerns arise.  Patient Instructions  6 units of short acting  . Insulin  For now and  See endocrinology this after noon.     Neta Mends. Panosh M.D.

## 2013-07-29 NOTE — Progress Notes (Signed)
Subjective:    Patient ID: Andrew Ruiz, male    DOB: August 13, 1991, 22 y.o.   MRN: [id number]  HPI Pt states 6 weeks of slight blurry vision from both eyes, and associated polyuria.   Past Medical History  Diagnosis Date  . ADHD (attention deficit hyperactivity disorder)     [facility]  . Allergy   . Ventricular pre-excitation     on ekg card eval  no restrictions  . History of precordial chest pain   . Pilonidal abscess     No past surgical history on file.  History   Social History  . Marital Status: Single    Spouse Name: N/A    Number of Children: N/A  . Years of Education: N/A   Occupational History  . Not on file.   Social History Main Topics  . Smoking status: Never Smoker   . Smokeless tobacco: Not on file  . Alcohol Use: No  . Drug Use: No  . Sexual Activity: Not on file   Other Topics Concern  . Not on file   Social History Narrative   ** Merged History Encounter **       HH of 4   Pets Cats,dogs and bird      Was at [employer]. 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    Lives alone in an apartment    Current Outpatient Prescriptions on File Prior to Visit  Medication Sig Dispense Refill  . Melatonin 5 MG TABS Take by mouth. PRN Sleep 1-2 tabs       No current facility-administered medications on file prior to visit.   No Known Allergies  Family History  Problem Relation Age of Onset  . ADD / ADHD Brother   no DM in immediate family  BP 116/78  Pulse 74  Ht 5\' 9"  (1.753 m)  Wt 172 lb (78.019 kg)  BMI 25.39 kg/m2  SpO2 98%  Review of Systems He has weight loss, leg cramps, excessive diaphoresis, and polydipsia.  denies fever, headache, chest pain, sob, n/v, dysuria, memory loss, depression, hypoglycemia, rhinorrhea, and easy bruising.      Objective:   Physical Exam VS: see vs page GEN: no distress HEAD:  head: no deformity eyes: no periorbital swelling, no proptosis external nose and ears are normal mouth: no lesion seen NECK: supple, thyroid is not enlarged CHEST WALL: no deformity LUNGS: clear to auscultation BREASTS:  No gynecomastia CV: reg rate and rhythm, no murmur ABD: abdomen is soft, nontender.  no hepatosplenomegaly.  not distended.  no hernia.   MUSCULOSKELETAL: muscle bulk and strength are grossly normal.  no obvious joint swelling.  gait is normal and steady EXTEMITIES: no deformity.  no ulcer on the feet.  feet are of normal color and temp.  no edema PULSES: dorsalis pedis intact bilat.  no carotid bruit NEURO:  cn 2-12 grossly intact.   readily moves all 4's.  sensation is intact to touch on the feet SKIN:  Normal texture and temperature.  No rash or suspicious lesion is visible.   NODES:  None palpable at the neck PSYCH: alert, oriented x3.  Does not appear anxious nor depressed.   lantus 30 units given in the office today.  i demonstrated pen technique. Lab Results  Component Value Date   WBC  5.5 07/28/2013   HGB 14.9 07/28/2013   HCT 42.6 07/28/2013   PLT 256.0 07/28/2013   GLUCOSE 797 Repeated and verified X2.* 07/28/2013   CHOL 206* 07/28/2013   TRIG 899.0* 07/28/2013   HDL 29.80* 07/28/2013   LDLDIRECT 69.2 07/28/2013   ALT 27 07/28/2013   AST 19 07/28/2013   NA 125* 07/28/2013   K 4.5 07/28/2013   CL 87* 07/28/2013   CREATININE 1.0 07/28/2013   BUN 13 07/28/2013   CO2 26 07/28/2013   TSH 2.12 07/28/2013   HGBA1C 12.8* 07/28/2013      Assessment & Plan:  Type 1 DM, new onset. Severe hyperglycemia.  This causes severe risk to his health. Hyponatremia, due to hyperglycemia. Severe hypertriglyceridemia: this will improve with insulin rx.

## 2013-07-29 NOTE — Telephone Encounter (Signed)
Patient seen in the office on 07/29/13

## 2013-07-30 ENCOUNTER — Encounter: Payer: Self-pay | Admitting: Endocrinology

## 2013-07-30 ENCOUNTER — Ambulatory Visit (INDEPENDENT_AMBULATORY_CARE_PROVIDER_SITE_OTHER): Payer: 59 | Admitting: Endocrinology

## 2013-07-30 VITALS — BP 126/70 | HR 78 | Wt 171.0 lb

## 2013-07-30 DIAGNOSIS — IMO0001 Reserved for inherently not codable concepts without codable children: Secondary | ICD-10-CM

## 2013-07-30 DIAGNOSIS — E1165 Type 2 diabetes mellitus with hyperglycemia: Secondary | ICD-10-CM

## 2013-07-30 MED ORDER — GLUCAGON (RDNA) 1 MG IJ KIT: 1.0000 mg | PACK | Freq: Once | INTRAMUSCULAR | Status: AC | PRN

## 2013-07-30 MED ORDER — INSULIN PEN NEEDLE 31G X 8 MM MISC: 1.0000 | Freq: Four times a day (QID) | Status: DC

## 2013-07-30 NOTE — Patient Instructions (Addendum)
Please take lantus, 20 units daily Add humalog, 8 units 3 times a day (just before each meal). check your blood sugar 4 times a day--before the 3 meals, and at bedtime.  also check if you have symptoms of your blood sugar being too high or too low.  please keep a record of the readings and bring it to your next appointment here.  please call us sooner if your blood sugar goes below 70, or if you have a lot of readings over 200.  Please come back for a follow-up appointment in 5 days.   i have sent a prescription to your pharmacy, for a medication called "glucagon."  This is a single-use emergency kit.  It will help you when your blood sugar is so low, you need someone else to help you.  The side-effect is nausea, so you should be turned on your side after getting this shot.   Please come back Friday.

## 2013-07-30 NOTE — Progress Notes (Signed)
  Subjective:    Patient ID: Andrew Ruiz, male    DOB: 1991-09-25, 22 y.o.   MRN: [id number]  HPI Pt returns for f/u of type 1 DM (dx'ed in august of 2014; he has mild if any neuropathy of the lower extremities; no associated complications.  However, he does have some numbness of the feet; he was started on insulin).  He says cbg was 500 last night, but was 231 this am.  pt states he feels better in general.  He had nocturia only twice last night.   Past Medical History  Diagnosis Date  . ADHD (attention deficit hyperactivity disorder)     [facility]  . Allergy   . Ventricular pre-excitation     on ekg card eval  no restrictions  . History of precordial chest pain   . Pilonidal abscess     No past surgical history on file.  History   Social History  . Marital Status: Single    Spouse Name: N/A    Number of Children: N/A  . Years of Education: N/A   Occupational History  . Not on file.   Social History Main Topics  . Smoking status: Never Smoker   . Smokeless tobacco: Not on file  . Alcohol Use: No  . Drug Use: No  . Sexual Activity: Not on file   Other Topics Concern  . Not on file   Social History Narrative   ** Merged History Encounter **       HH of 4   Pets Cats,dogs and bird      Was at [employer]. 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    Lives alone in an apartment    Current Outpatient Prescriptions on File Prior to Visit  Medication Sig Dispense Refill  . glucose blood (ACCU-CHEK SMARTVIEW) test strip 1 each by Other route 4 (four) times daily. And lancets 4/day 250.01  120 each  12  . Melatonin 5 MG TABS Take by mouth. PRN Sleep 1-2 tabs       No current facility-administered medications on file prior to visit.    No Known Allergies  Family History  Problem Relation Age of Onset  . ADD / ADHD Brother    BP 126/70   Pulse 78  Wt 171 lb (77.565 kg)  BMI 25.24 kg/m2  SpO2 97%  Review of Systems denies hypoglycemia and n/v    Objective:   Physical Exam VITAL SIGNS:  See vs page GENERAL: no distress SKIN:  Insulin injection sites at the anterior abdomen are normal.   Labs: We reviewed his cbg's together.    Assessment & Plan:  DM: control is improved, but he needs increased rx.   Numbness.  This is a reversible acute neuropathy.   Total of 35 min ov today, with 20 minutes of counseling.

## 2013-07-31 ENCOUNTER — Telehealth: Payer: Self-pay | Admitting: Internal Medicine

## 2013-07-31 ENCOUNTER — Ambulatory Visit: Payer: 59 | Admitting: Internal Medicine

## 2013-07-31 NOTE — Telephone Encounter (Signed)
Spoke to the pharmacy and the patient informed me he has found test strips at another pharmacy.  He is no longer in need of supplies.

## 2013-07-31 NOTE — Telephone Encounter (Signed)
Pt states he is having trouble getting the test strips for the test meter Dr Fabian Sharp gave him. [facility] says [facility] be 72 hours before they will have. Pt would like to know if you have sample test strips to get him through. Pt has one test strip left. [employer] fast streamer for  [employer]

## 2013-08-01 ENCOUNTER — Telehealth: Payer: Self-pay | Admitting: Endocrinology

## 2013-08-01 ENCOUNTER — Ambulatory Visit (INDEPENDENT_AMBULATORY_CARE_PROVIDER_SITE_OTHER): Payer: 59 | Admitting: Endocrinology

## 2013-08-01 ENCOUNTER — Encounter: Payer: Self-pay | Admitting: Endocrinology

## 2013-08-01 VITALS — BP 128/80 | HR 78 | Ht 66.0 in | Wt 177.0 lb

## 2013-08-01 DIAGNOSIS — IMO0001 Reserved for inherently not codable concepts without codable children: Secondary | ICD-10-CM

## 2013-08-01 DIAGNOSIS — E1165 Type 2 diabetes mellitus with hyperglycemia: Secondary | ICD-10-CM

## 2013-08-01 NOTE — Telephone Encounter (Signed)
This time is ok

## 2013-08-01 NOTE — Telephone Encounter (Signed)
Pt in the office this afternoon and told to follow up in one week. Pt will be out of town on Friday of next week. I had to put him in on Thursday but Dr. Everardo All had no openings on Thursday. I double booked him at 815am. If this time is no good, please let me know-I will need an alternate time to put him in. Thanks! Sherri

## 2013-08-01 NOTE — Patient Instructions (Addendum)
Please take lantus, 20 units daily Please increase humalog to 12 units 3 times a day (just before each meal). check your blood sugar 4 times a day--before the 3 meals, and at bedtime.  also check if you have symptoms of your blood sugar being too high or too low.  please keep a record of the readings and bring it to your next appointment here.  please call us sooner if your blood sugar goes below 70, or if you have a lot of readings over 200.   Please come back for a follow-up appointment in 5-7 days.

## 2013-08-01 NOTE — Progress Notes (Signed)
Subjective:    Patient ID: Andrew Ruiz, male    DOB: 11/22/1991, 22 y.o.   MRN: [id number]  HPI Pt returns for f/u of type 1 DM (dx'ed in august of 2014; he has mild if any neuropathy of the lower extremities; no associated complications.  However, he does have some numbness of the feet; he was started on insulin).  cbg's are 200's-300's. It is in general higher as the day goes on Past Medical History  Diagnosis Date  . ADHD (attention deficit hyperactivity disorder)     [facility]  . Allergy   . Ventricular pre-excitation     on ekg card eval  no restrictions  . History of precordial chest pain   . Pilonidal abscess     No past surgical history on file.  History   Social History  . Marital Status: Single    Spouse Name: N/A    Number of Children: N/A  . Years of Education: N/A   Occupational History  . Not on file.   Social History Main Topics  . Smoking status: Never Smoker   . Smokeless tobacco: Not on file  . Alcohol Use: No  . Drug Use: No  . Sexual Activity: Not on file   Other Topics Concern  . Not on file   Social History Narrative   ** Merged History Encounter **       HH of 4   Pets Cats,dogs and bird      Was at [employer]. 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    Lives alone in an apartment    Current Outpatient Prescriptions on File Prior to Visit  Medication Sig Dispense Refill  . glucagon (GLUCAGON EMERGENCY) 1 MG injection Inject 1 mg into the vein once as needed.  1 each  12  . glucose blood (ACCU-CHEK SMARTVIEW) test strip 1 each by Other route 4 (four) times daily. And lancets 4/day 250.01  120 each  12  . Insulin Glargine (LANTUS SOLOSTAR [place]) Inject 20 Units into the skin daily.      . Insulin Lispro, Human, (HUMALOG KWIKPEN [place]) Inject 12 Units into the skin 3 (three) times daily with meals.       . Insulin Pen  Needle 31G X 8 MM MISC 1 Device by Does not apply route 4 (four) times daily.  120 each  0  . Melatonin 5 MG TABS Take by mouth. PRN Sleep 1-2 tabs       No current facility-administered medications on file prior to visit.    No Known Allergies  Family History  Problem Relation Age of Onset  . ADD / ADHD Brother     BP 128/80  Pulse 78  Ht 5\' 6"  (1.676 m)  Wt 177 lb (80.287 kg)  BMI 28.58 kg/m2  SpO2 98%  Review of Systems .denies hypoglycemia.  He has regained a few lbs.      Objective:   Physical Exam VITAL SIGNS:  See vs page GENERAL: no distress PSYCH: Alert and oriented x 3.  Does not appear anxious nor depressed.     Assessment & Plan:  DM: he needs increased rx.  This insulin regimen was chosen from multiple options, as it best matches his insulin to his changing requirements throughout the day.  The benefits  of glycemic control must be weighed against the risks of hypoglycemia.  30 minute ov, including 20 minutes of counseling.

## 2013-08-04 ENCOUNTER — Telehealth: Payer: Self-pay | Admitting: Endocrinology

## 2013-08-04 MED ORDER — GLUCOSE BLOOD VI STRP: ORAL_STRIP | Status: DC

## 2013-08-04 NOTE — Telephone Encounter (Signed)
Andrew Ruiz, Andrew Ruiz - 08/04/2013 11:47 AM More Detail >>      Griffin Dakin      Sent: Mon August 04, 2013 11:47 AM      To: Sharlyne Pacas, CMA               [facility] Size     Small     Medium     Large     Extra Extra Large           KYLLE LALL  08/04/2013   Telephone  MRN:  [id number]   Description: 22 year old male  Provider: Romero Belling, MD  Department: [facility]            Call Documentation    Sharlyne Pacas, CMA at 08/04/2013  5:09 PM    Status: Signed                   Pt's mother advised, pt should continue insulin dose from pt's last avs, also pt would like the accu- check test strips sent to the pharmacy         Romero Belling, MD at 08/04/2013  5:03 PM    Status: Signed                   Please take this insulin amounts in avs from last ov Which brand of strips do you want me to send?         Sharlyne Pacas, CMA at 08/04/2013  4:53 PM    Status: Signed                   Pt called stating he thinks you told him to increase his insulin needs to know what dose?         Sharlyne Pacas, CMA at 08/04/2013  4:44 PM    Status: Signed                   Pt's mother called pt is testing at least 8 times daily, [facility]           Sharlyne Pacas, CMA at 08/04/2013  2:25 PM    Status: Signed                   Pt's mother left message stating ins. Will not pay for test strips because pt foes not have a hx of tyoe 1 dm, nor any insulin hx, please advise [phone]         Griffin Dakin at 08/04/2013 11:47 AM    Status: Signed                   Call about BS and dosing, # [phone]           Encounter MyChart Messages    No messages in this encounter      Routing History    Priority Sent On From To Message Type      08/04/2013  5:09 PM Sharlyne Pacas, CMA Romero Belling, MD Patient Calls      08/04/2013  5:03 PM Romero Belling, MD Sharlyne Pacas, CMA Patient Calls      08/04/2013  4:53 PM Sharlyne Pacas, CMA Romero Belling, MD Patient Calls      08/04/2013  4:44 PM Sharlyne Pacas, CMA Romero Belling, MD Patient Calls  08/04/2013  2:25 PM Sharlyne Pacas, CMA Romero Belling, MD Patient Calls      08/04/2013  2:23 PM Sharlyne Pacas, CMA Romero Belling, MD Patient Calls      08/04/2013 11:47 AM Sherri R Glori Bickers, CMA Patient Calls     Created by    Griffin Dakin on 08/04/2013 11:47 AM            Visit Pharmacy    [id number] [place], [place] - [address]      Contacts      Type Contact Phone    08/04/2013 11:47 AM Phone (Incoming) Adolphe, Fortunato Ruiz (Self) [phone] (H)

## 2013-08-04 NOTE — Telephone Encounter (Signed)
i sent rx 

## 2013-08-04 NOTE — Telephone Encounter (Signed)
Pt's mother left message stating ins. Will not pay for test strips because pt foes not have a hx of tyoe 1 dm, nor any insulin hx, please advise [phone]

## 2013-08-04 NOTE — Telephone Encounter (Signed)
Please take this insulin amounts in avs from last ov Which brand of strips do you want me to send?

## 2013-08-04 NOTE — Telephone Encounter (Signed)
Pt called stating he thinks you told him to increase his insulin needs to know what dose?

## 2013-08-04 NOTE — Telephone Encounter (Signed)
Pt's mother called pt is testing at least 8 times daily, [facility]

## 2013-08-04 NOTE — Telephone Encounter (Signed)
Call about BS and dosing, # [phone]

## 2013-08-04 NOTE — Telephone Encounter (Signed)
Pt's mother advised, pt should continue insulin dose from pt's last avs, also pt would like the accu- check test strips sent to the pharmacy

## 2013-08-05 ENCOUNTER — Telehealth: Payer: Self-pay

## 2013-08-07 ENCOUNTER — Encounter: Payer: Self-pay | Admitting: Endocrinology

## 2013-08-07 ENCOUNTER — Ambulatory Visit (INDEPENDENT_AMBULATORY_CARE_PROVIDER_SITE_OTHER): Payer: 59 | Admitting: Endocrinology

## 2013-08-07 VITALS — BP 122/80 | HR 68 | Ht 68.0 in | Wt 178.0 lb

## 2013-08-07 DIAGNOSIS — E1165 Type 2 diabetes mellitus with hyperglycemia: Secondary | ICD-10-CM

## 2013-08-07 DIAGNOSIS — IMO0001 Reserved for inherently not codable concepts without codable children: Secondary | ICD-10-CM

## 2013-08-07 MED ORDER — GLUCOSE BLOOD VI STRP: ORAL_STRIP | Status: DC

## 2013-08-07 NOTE — Progress Notes (Signed)
  Subjective:    Patient ID: Andrew Ruiz, male    DOB: 11-20-1991, 22 y.o.   MRN: [id number]  HPI Pt returns for f/u of type 1 DM (dx'ed in august of 2014; he has mild if any neuropathy of the lower extremities; no associated complications.  However, he does have some numbness of the feet; he was started on insulin).  cbg's are 200's-300's. It is in general higher in the morning.  He has moderate blurry vision from both eyes, but no assoc headache.  He saw opthal, who gave him some contacts.   Past Medical History  Diagnosis Date  . ADHD (attention deficit hyperactivity disorder)     [facility] no [facility]  . Allergy   . Ventricular pre-excitation     on ekg card eval  no restrictions  . History of precordial chest pain   . Pilonidal abscess     No past surgical history on file.  History   Social History  . Marital Status: Single    Spouse Name: N/A    Number of Children: N/A  . Years of Education: N/A   Occupational History  . Not on file.   Social History Main Topics  . Smoking status: Never Smoker   . Smokeless tobacco: Not on file  . Alcohol Use: No  . Drug Use: No  . Sexual Activity: Not on file   Other Topics Concern  . Not on file   Social History Narrative   ** Merged History Encounter **       HH of 4   Pets Cats,dogs and bird      Was at [employer]. 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    Lives alone in an apartment    Current Outpatient Prescriptions on File Prior to Visit  Medication Sig Dispense Refill  . glucagon (GLUCAGON EMERGENCY) 1 MG injection Inject 1 mg into the vein once as needed.  1 each  12  . Insulin Glargine (LANTUS SOLOSTAR [place]) Inject 25 Units into the skin daily.       . Insulin Lispro, Human, (HUMALOG KWIKPEN [place]) Inject 12 Units into the skin 3 (three) times daily with meals.       . Insulin Pen Needle 31G X 8 MM  MISC 1 Device by Does not apply route 4 (four) times daily.  120 each  0  . Melatonin 5 MG TABS Take by mouth. PRN Sleep 1-2 tabs       No current facility-administered medications on file prior to visit.   No Known Allergies  Family History  Problem Relation Age of Onset  . ADD / ADHD Brother    BP 122/80  Pulse 68  Ht 5\' 8"  (1.727 m)  Wt 178 lb (80.74 kg)  BMI 27.07 kg/m2  SpO2 98%  Review of Systems denies hypoglycemia.      Objective:   Physical Exam VITAL SIGNS:  See vs page GENERAL: no distress     Assessment & Plan:  DM: he needs increased rx.  This insulin regimen was chosen from multiple options, as it best matches his insulin to his changing requirements throughout the day.  The benefits of glycemic control must be weighed against the risks of hypoglycemia.

## 2013-08-07 NOTE — Patient Instructions (Signed)
Please increase the lantus to 25 units at bedtime Please continue the same humalog.  Please come back for a follow-up appointment in 2 weeks.  check your blood sugar 4 times a day--before the 3 meals, and at bedtime.  also check if you have symptoms of your blood sugar being too high or too low.  please keep a record of the readings and bring it to your next appointment here.  please call us sooner if your blood sugar goes below 70, or if you have a lot of readings over 200.

## 2013-08-14 NOTE — Telephone Encounter (Signed)
Done

## 2013-08-15 ENCOUNTER — Ambulatory Visit (INDEPENDENT_AMBULATORY_CARE_PROVIDER_SITE_OTHER): Payer: 59 | Admitting: Endocrinology

## 2013-08-15 ENCOUNTER — Encounter: Payer: Self-pay | Admitting: Endocrinology

## 2013-08-15 VITALS — BP 126/80 | HR 80 | Wt 185.0 lb

## 2013-08-15 DIAGNOSIS — E1165 Type 2 diabetes mellitus with hyperglycemia: Secondary | ICD-10-CM

## 2013-08-15 DIAGNOSIS — IMO0001 Reserved for inherently not codable concepts without codable children: Secondary | ICD-10-CM

## 2013-08-15 MED ORDER — INSULIN GLARGINE 100 UNIT/ML SOLOSTAR PEN: 25.0000 [IU] | PEN_INJECTOR | Freq: Every day | SUBCUTANEOUS | Status: DC

## 2013-08-15 MED ORDER — INSULIN LISPRO 100 UNIT/ML (KWIKPEN): 9.0000 [IU] | PEN_INJECTOR | Freq: Three times a day (TID) | SUBCUTANEOUS | Status: DC

## 2013-08-15 NOTE — Patient Instructions (Addendum)
Please continue the lantus, 25 units at bedtime Please decrease humalog to 9 units 3 times a day (just before each meal).  Please come back for a follow-up appointment in 4 weeks.   check your blood sugar 4 times a day--before the 3 meals, and at bedtime.  also check if you have symptoms of your blood sugar being too high or too low.  please keep a record of the readings and bring it to your next appointment here.  please call us sooner if your blood sugar goes below 70, or if you have a lot of readings over 200.   i have requested fasting blood tests for the pump.

## 2013-08-15 NOTE — Progress Notes (Signed)
  Subjective:    Patient ID: Andrew Ruiz, male    DOB: Oct 25, 1991, 22 y.o.   MRN: [id number]  HPI Pt returns for f/u of type 1 DM (dx'ed in august of 2014; he has mild if any neuropathy of the lower extremities; no associated complications.  Pt says he is having mild hypoglycemia when a meal is smaller than expected.  He says cbg's are highest in am, which he feels might be rebound hyperglycemia.  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   Past Medical History  Diagnosis Date  . ADHD (attention deficit hyperactivity disorder)     [facility]  . Allergy   . Ventricular pre-excitation     on ekg card eval  no restrictions  . History of precordial chest pain   . Pilonidal abscess     No past surgical history on file.  History   Social History  . Marital Status: Single    Spouse Name: N/A    Number of Children: N/A  . Years of Education: N/A   Occupational History  . Not on file.   Social History Main Topics  . Smoking status: Never Smoker   . Smokeless tobacco: Not on file  . Alcohol Use: No  . Drug Use: No  . Sexual Activity: Not on file   Other Topics Concern  . Not on file   Social History Narrative   ** Merged History Encounter **       HH of 4   Pets Cats,dogs and bird      Was at [employer]. 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    Lives alone in an apartment   Current Outpatient Prescriptions on File Prior to Visit  Medication Sig Dispense Refill  . glucagon (GLUCAGON EMERGENCY) 1 MG injection Inject 1 mg into the vein once as needed.  1 each  12  . glucose blood (ACCU-CHEK SMARTVIEW) test strip 8/day, and lancets 250.01  240 each  12  . Insulin Pen Needle 31G X 8 MM MISC 1 Device by Does not apply route 4 (four) times daily.  120 each  0  . Melatonin 5 MG TABS Take by mouth. PRN Sleep 1-2 tabs       No current  facility-administered medications on file prior to visit.   No Known Allergies  Family History  Problem Relation Age of Onset  . ADD / ADHD Brother    BP 126/80  Pulse 80  Wt 185 lb (83.915 kg)  BMI 28.14 kg/m2  SpO2 97%  Review of Systems Denies LOC.  He has gained weight.      Objective:   Physical Exam VITAL SIGNS:  See vs page GENERAL: no distress PSYCH: Alert and oriented x 3.  Does not appear anxious nor depressed.      Assessment & Plan:  DM: apparently overcontrolled. Blurry vision, improved Shift work.   This complicates the rx of DM.

## 2013-08-20 ENCOUNTER — Telehealth: Payer: Self-pay | Admitting: Endocrinology

## 2013-08-20 ENCOUNTER — Ambulatory Visit: Payer: 59 | Admitting: *Deleted

## 2013-08-20 NOTE — Telephone Encounter (Signed)
1 sample  Of each

## 2013-08-25 ENCOUNTER — Encounter: Payer: 59 | Attending: Endocrinology | Admitting: *Deleted

## 2013-08-25 ENCOUNTER — Encounter: Payer: Self-pay | Admitting: *Deleted

## 2013-08-25 VITALS — Ht 69.0 in | Wt 186.6 lb

## 2013-08-25 DIAGNOSIS — E1165 Type 2 diabetes mellitus with hyperglycemia: Secondary | ICD-10-CM | POA: Insufficient documentation

## 2013-08-25 DIAGNOSIS — IMO0002 Reserved for concepts with insufficient information to code with codable children: Secondary | ICD-10-CM | POA: Insufficient documentation

## 2013-08-25 DIAGNOSIS — Z713 Dietary counseling and surveillance: Secondary | ICD-10-CM | POA: Insufficient documentation

## 2013-08-25 NOTE — Progress Notes (Signed)
Appt start time: 1500 end time:  1600.  Assessment:  Patient was seen on  08/25/13 for individual diabetes education. He is here with his Dad and his Grandmother, who both appear very supportive. He states he lives alone with dog. 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 He was diagnosed with diabetes about 4 weeks ago. He states he is testing at least 5 times a day, more if he feels low. Reported range of 56 to 170 mg/dl. Physically active with job. Runs and works out at [employer]  Current HbA1c: 12.8% on 07/28/13  MEDICATIONS: see list. Diabetes medications are Lantus and Humalogf   DIETARY INTAKE:  Usual eating pattern includes 2 meals and 0 snacks per day.  Everyday foods include good variety of all food groups.  Avoided foods include high fat foods, .    24-hr recall:  B (10 AM): lunch food such as burger, baked beans OR deli sandwich with crackers or yogurt at home OR eggs, bacon, yogurt, diet Mountain Dew or 2 % milk Snk ( AM): none  L ( PM): none Snk ( PM): none D (9 PM): Chipotle Bowl OR Jimmie Johns 8" sandwich OR cheese crackers, PNB jelly sandwich, almonds Snk ( PM): none Beverages: milk, water, diet [employer]  Usual physical activity: running and gym regularly with job  Estimated energy needs: 2100 calories 235 g carbohydrates 158 g protein 58 g fat  Progress Towards Goal(s):  In progress.   Nutritional Diagnosis:  NB-1.1 Food and nutrition-related knowledge deficit As related to new diagnosis of DM1.  As evidenced by A1c of 12.8% on 07/28/13.    Intervention:  Nutrition counseling provided.  Discussed diabetes disease process and treatment options.  Discussed physiology of diabetes and role of obesity on insulin resistance.  Encouraged moderate weight reduction to improve glucose levels.  Discussed role of medications and diet in glucose control  Provided education on macronutrients on glucose levels.  Provided  education on carb counting, importance of regularly scheduled meals/snacks, and meal planning  Discussed effects of physical activity on glucose levels and long-term glucose control.  Recommended 150 minutes of physical activity/week.  Reviewed patient medications.  Discussed role of medication on blood glucose and possible side effects  Discussed blood glucose monitoring and interpretation.  Discussed recommended target ranges and individual ranges.    Described short-term complications: hyper- and hypo-glycemia.  Discussed causes,symptoms, and treatment options.  Discussed prevention, detection, and treatment of long-term complications.  Discussed the role of prolonged elevated glucose levels on body systems. Plan to discuss at next visit  Role of stress on blood glucose levels and discussed strategies to manage psychosocial issues.  Recommendations for long-term diabetes self-care.  Established checklist for medical, dental, and emotional self-care.  Handouts given during visit include: Living Well with Diabetes Carb Counting and Food Label handouts Meal Plan Card  Insulin Action handout  Barriers to learning/adherance to lifestyle change: dealing with new diagnosis of chronic disease  Diabetes self-care support plan:   [facility] support group  Continued diabetes education  Monitoring/Evaluation:  Dietary intake, exercise, SMBG, and body weight prn.

## 2013-08-29 NOTE — Patient Instructions (Signed)
Plan:  Aim for 4 Carb Choices per meal (60 grams) +/- 1 either way  Aim for 0-2 Carbs per snack if hungry  Consider reading food labels for Total Carbohydrate and Fat Grams of foods Continue with your current activity level daily as tolerated Continue checking BG at alternate times per day as directed by MD  Continue taking medication of insuiln as directed by MD

## 2013-09-09 ENCOUNTER — Telehealth: Payer: Self-pay

## 2013-09-09 MED ORDER — INSULIN LISPRO 100 UNIT/ML ~~LOC~~ SOLN: SUBCUTANEOUS | Status: DC

## 2013-09-09 NOTE — Telephone Encounter (Signed)
sent 

## 2013-09-09 NOTE — Telephone Encounter (Signed)
Pt left voicemail stating he needs rx refill go the Humalog vial for his pump, pt states you should have received a pump order form [phone]

## 2013-09-09 NOTE — Telephone Encounter (Signed)
Pt advised.

## 2013-09-10 ENCOUNTER — Telehealth: Payer: Self-pay | Admitting: Endocrinology

## 2013-09-10 NOTE — Telephone Encounter (Signed)
please call patient: take a basal rate of 0.7 units/hr. mealtime bolus of 1 unit/ 25grams carbohydrate correction bolus (which some people call "sensitivity," or "insulin sensitivity ratio," or just "isr") of 1 unit for each 50 by which your glucose exceeds 100.

## 2013-09-10 NOTE — Telephone Encounter (Signed)
Pt advised.

## 2013-09-10 NOTE — Telephone Encounter (Signed)
Please come in for ov 2-3 days after starting pump.  Please bring a wrapped reservoir and infusion set, so we can load into the computer.

## 2013-09-12 ENCOUNTER — Telehealth: Payer: Self-pay | Admitting: Endocrinology

## 2013-09-12 MED ORDER — INSULIN GLARGINE 100 UNIT/ML SOLOSTAR PEN: 25.0000 [IU] | PEN_INJECTOR | Freq: Every day | SUBCUTANEOUS | Status: DC

## 2013-09-12 NOTE — Telephone Encounter (Signed)
Do you want pt start on saline or insulin on Sunday?

## 2013-09-12 NOTE — Telephone Encounter (Signed)
Ok.  Pt needs ov here 2-3 days after starting insulin in pump.

## 2013-09-12 NOTE — Telephone Encounter (Signed)
done

## 2013-09-12 NOTE — Telephone Encounter (Signed)
Andrew Ruiz, with [facility] [phone] left voicemail will start of the insulin be done here in the office, pt will start saline on Sunday and insulin 3 days after please advise

## 2013-09-17 ENCOUNTER — Ambulatory Visit: Payer: 59 | Admitting: Endocrinology

## 2013-09-18 ENCOUNTER — Telehealth: Payer: Self-pay | Admitting: Endocrinology

## 2013-09-18 MED ORDER — GLUCOSE BLOOD VI STRP: ORAL_STRIP | Status: DC

## 2013-09-19 ENCOUNTER — Telehealth: Payer: Self-pay | Admitting: Endocrinology

## 2013-09-19 MED ORDER — GLUCOSE BLOOD VI STRP: ORAL_STRIP | Status: DC

## 2013-09-19 NOTE — Telephone Encounter (Signed)
Pt advised test strips were sent yesterday

## 2013-09-29 ENCOUNTER — Telehealth: Payer: Self-pay | Admitting: Endocrinology

## 2013-09-29 NOTE — Telephone Encounter (Signed)
[name] w/ Insulin Pump Trainer w/ [facility], please call [phone] , also faxing order sheets-Dr. Everardo All signed the sheet but did not write any orders in. / Andrew Ruiz

## 2013-10-01 NOTE — Telephone Encounter (Signed)
[name], call #2, please return call. See previous documentation for additional info / Sherri

## 2013-10-01 NOTE — Telephone Encounter (Signed)
Left message jon to call back

## 2013-10-13 ENCOUNTER — Encounter: Payer: Self-pay | Admitting: Endocrinology

## 2013-10-13 ENCOUNTER — Ambulatory Visit (INDEPENDENT_AMBULATORY_CARE_PROVIDER_SITE_OTHER): Payer: 59 | Admitting: Endocrinology

## 2013-10-13 VITALS — BP 122/82 | HR 65 | Temp 98.6°F | Resp 12 | Ht 69.0 in | Wt 191.4 lb

## 2013-10-13 DIAGNOSIS — E1165 Type 2 diabetes mellitus with hyperglycemia: Secondary | ICD-10-CM

## 2013-10-13 DIAGNOSIS — IMO0001 Reserved for inherently not codable concepts without codable children: Secondary | ICD-10-CM

## 2013-10-13 NOTE — Patient Instructions (Addendum)
Please reduce the basal rate to 0.8 units/hr. Please continue a mealtime bolus of 1 unit/ 11 grams carbohydrate.   Also, please the correction bolus (which some people call "sensitivity," or "insulin sensitivity ratio," or just "isr") of 1 unit for each 50 by which your glucose exceeds 100. Please come back for a follow-up appointment in 1 month.   check your blood sugar 4 times a day--before the 3 meals, and at bedtime.  also check if you have symptoms of your blood sugar being too high or too low.  please keep a record of the readings and bring it to your next appointment here.  please call us sooner if your blood sugar goes below 70, or if you have a lot of readings over 200.    Please call or message [place] with the names of your reservoir and infusion set.

## 2013-10-13 NOTE — Progress Notes (Signed)
Subjective:    Patient ID: Andrew Ruiz, male    DOB: Nov 13, 1991, 22 y.o.   MRN: [id number]  HPI Pt returns for f/u of type 1 DM (dx'ed in august of 2014; he has mild if any neuropathy of the lower extremities; no associated complications).  He has been on pump rx x 2 weeks (accucheck spirit).  pt states he feels well in general.  Since on the pump, he has had only 1 episode of hypoglycemia, and this was mild.  This happened at hs.  He says cbg's vary from 70-90.  There is no trend throughout the day.   He takes a total of approx 31 units per day, via the pump.   Past Medical History  Diagnosis Date  . ADHD (attention deficit hyperactivity disorder)     [facility]  . Allergy   . Ventricular pre-excitation     on ekg card eval  no restrictions  . History of precordial chest pain   . Pilonidal abscess   . Diabetes mellitus without complication     No past surgical history on file.  History   Social History  . Marital Status: Single    Spouse Name: N/A    Number of Children: N/A  . Years of Education: N/A   Occupational History  . Not on file.   Social History Main Topics  . Smoking status: Never Smoker   . Smokeless tobacco: Not on file  . Alcohol Use: No  . Drug Use: No  . Sexual Activity: Not on file   Other Topics Concern  . Not on file   Social History Narrative   ** Merged History Encounter **       HH of 4   Pets Cats,dogs and bird      Was at [employer]. 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    Lives alone in an apartment   Current Outpatient Prescriptions on File Prior to Visit  Medication Sig Dispense Refill  . glucagon (GLUCAGON EMERGENCY) 1 MG injection Inject 1 mg into the vein once as needed.  1 each  12  . Melatonin 5 MG TABS Take by mouth. PRN Sleep 1-2 tabs      . glucose blood (ACCU-CHEK SMARTVIEW) test strip 8/day, and  lancets 250.01  240 each  12   No current facility-administered medications on file prior to visit.   No Known Allergies  Family History  Problem Relation Age of Onset  . ADD / ADHD Brother    BP 122/82  Pulse 65  Temp(Src) 98.6 F (37 C)  Resp 12  Ht 5\' 9"  (1.753 m)  Wt 191 lb 6.4 oz (86.818 kg)  BMI 28.25 kg/m2  SpO2 96%  Review of Systems Denies LOC and weight change.      Objective:   Physical Exam VITAL SIGNS:  See vs page GENERAL: no distress SKIN:  Insulin infusion sites at the anterior abdomen are normal.        Assessment & Plan:  DM: This insulin pump regimen was chosen from multiple options, as it best matches his insulin to his changing requirements throughout the day.  The benefits of glycemic control must be weighed against the risks of hypoglycemia.  Overcontrolled. 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

## 2013-10-23 NOTE — Telephone Encounter (Signed)
Phone note completed ° °

## 2013-11-03 ENCOUNTER — Other Ambulatory Visit: Payer: Self-pay | Admitting: Endocrinology

## 2013-11-12 ENCOUNTER — Ambulatory Visit: Payer: 59 | Admitting: Endocrinology

## 2013-11-12 DIAGNOSIS — Z0289 Encounter for other administrative examinations: Secondary | ICD-10-CM

## 2014-01-29 ENCOUNTER — Emergency Department (HOSPITAL_COMMUNITY): Payer: Worker's Compensation

## 2014-01-29 ENCOUNTER — Encounter (HOSPITAL_COMMUNITY): Payer: Self-pay | Admitting: Emergency Medicine

## 2014-01-29 ENCOUNTER — Emergency Department (HOSPITAL_COMMUNITY)
Admission: EM | Admit: 2014-01-29 | Discharge: 2014-01-29 | Disposition: A | Discharge status: Home / Self Care | Payer: Worker's Compensation | Attending: Emergency Medicine | Admitting: Emergency Medicine

## 2014-01-29 DIAGNOSIS — S6000XA Contusion of unspecified finger without damage to nail, initial encounter: Secondary | ICD-10-CM | POA: Insufficient documentation

## 2014-01-29 DIAGNOSIS — Z872 Personal history of diseases of the skin and subcutaneous tissue: Secondary | ICD-10-CM | POA: Insufficient documentation

## 2014-01-29 DIAGNOSIS — S60111A Contusion of right thumb with damage to nail, initial encounter: Secondary | ICD-10-CM

## 2014-01-29 DIAGNOSIS — W208XXA Other cause of strike by thrown, projected or falling object, initial encounter: Secondary | ICD-10-CM | POA: Insufficient documentation

## 2014-01-29 DIAGNOSIS — Y929 Unspecified place or not applicable: Secondary | ICD-10-CM | POA: Insufficient documentation

## 2014-01-29 DIAGNOSIS — Z79899 Other long term (current) drug therapy: Secondary | ICD-10-CM | POA: Insufficient documentation

## 2014-01-29 DIAGNOSIS — Z794 Long term (current) use of insulin: Secondary | ICD-10-CM | POA: Insufficient documentation

## 2014-01-29 DIAGNOSIS — W010XXA Fall on same level from slipping, tripping and stumbling without subsequent striking against object, initial encounter: Secondary | ICD-10-CM | POA: Insufficient documentation

## 2014-01-29 DIAGNOSIS — Z8679 Personal history of other diseases of the circulatory system: Secondary | ICD-10-CM | POA: Insufficient documentation

## 2014-01-29 DIAGNOSIS — Z8659 Personal history of other mental and behavioral disorders: Secondary | ICD-10-CM | POA: Insufficient documentation

## 2014-01-29 DIAGNOSIS — E109 Type 1 diabetes mellitus without complications: Secondary | ICD-10-CM | POA: Insufficient documentation

## 2014-01-29 DIAGNOSIS — S62639A Displaced fracture of distal phalanx of unspecified finger, initial encounter for closed fracture: Secondary | ICD-10-CM

## 2014-01-29 DIAGNOSIS — Y939 Activity, unspecified: Secondary | ICD-10-CM | POA: Insufficient documentation

## 2014-01-29 LAB — GLUCOSE, CAPILLARY: Glucose-Capillary: 117 mg/dL — ABNORMAL HIGH (ref 70–99)

## 2014-01-29 MED ORDER — IBUPROFEN 600 MG PO TABS: 600.0000 mg | ORAL_TABLET | Freq: Four times a day (QID) | ORAL | Status: AC | PRN

## 2014-01-29 MED ORDER — HYDROCODONE-ACETAMINOPHEN 5-325 MG PO TABS: 1.0000 | ORAL_TABLET | ORAL | Status: DC | PRN

## 2014-01-29 NOTE — ED Notes (Signed)
Pt states understanding of discharge instructions 

## 2014-01-29 NOTE — ED Provider Notes (Signed)
CSN: [id number]     Arrival date & time 01/29/14  0340 History   First MD Initiated Contact with Patient 01/29/14 0350     Chief Complaint  Patient presents with  . 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 While on duty last night at approx 2100, the patient slipped, fell onto his right hand and then sustained blunt trauma to the right distal thumb from flashlight which fell on top of it.   Since that time, the patient has had aching, throbbing pain over the right distal thumb. 8/10. Nothing worsens or relieves. No radiation of pain. No pain in any other region.   Patient requests that we check his blood glucose level.   Past Medical History  Diagnosis Date  . ADHD (attention deficit hyperactivity disorder)     [facility]  . Allergy   . Ventricular pre-excitation     on ekg card eval  no restrictions  . History of precordial chest pain   . Pilonidal abscess   . Diabetes mellitus without complication    No past surgical history on file. Family History  Problem Relation Age of Onset  . ADD / ADHD Brother    History  Substance Use Topics  . Smoking status: Never Smoker   . Smokeless tobacco: Not on file  . Alcohol Use: No    Review of Systems  Focused 6 point ROS obtained and is negative with the exception of sx noted above.   Allergies  Review of patient's allergies indicates no known allergies.  Home Medications   Current Outpatient Rx  Name  Route  Sig  Dispense  Refill  . ACCU-CHEK FASTCLIX LANCETS MISC               . glucagon (GLUCAGON EMERGENCY) 1 MG injection   Intravenous   Inject 1 mg into the vein once as needed.   1 each   12   . glucose blood (ACCU-CHEK SMARTVIEW) test strip      8/day, and lancets 250.01   240 each   12   . HUMALOG 100 UNIT/ML injection      FOR USE IN PUMP,  TOTAL OF 60 UNITS PER DAY.   20 mL   1   . insulin lispro (HUMALOG) 100 UNIT/ML injection      For use in pump, total of 50 units per day.         . Melatonin 5 MG TABS   Oral   Take by mouth. PRN Sleep 1-2 tabs          BP 138/94  Pulse 71  Temp(Src) 96.7 F (35.9 C) (Oral)  Resp 18  SpO2 97% Physical Exam Gen: well developed and well nourished appearing Head: NCAT Eyes: PERL, EOMI Nose: normal to inspection Back: no ttp Skin: warm and dry Ext: subungual hematoma of the right thumb with ttp over the distal thumb, cap refill < 2s, FROM at all finger joints and right wrist. Remainder of the RUE is nontender and normal to inspection.  Neuro: CN ii-xii grossly intact, no focal deficits Psyche; normal affect,  calm and cooperative.   ED Course  Procedures (including critical care time) Labs Review Labs Reviewed  GLUCOSE, CAPILLARY - Abnormal; Notable for the following:    Glucose-Capillary 117 (*)    All other components within normal limits  PROCEDURE NOTE:  trepination of the right thumb nail was performed with an 18g needle. approx 1mL of blood released. Patient tolerated the procedure well and there were not complications.   DG Finger Thumb Right (Final result)  Result time: 01/29/14 04:43:32    Final result by Rad Results In Interface (01/29/14 04:43:32)    Narrative:   CLINICAL DATA: Fall, right hand pain.  EXAM: RIGHT THUMB 2+V  COMPARISON: None.  FINDINGS: There is a fracture through the tip of the right thumb distal phalanx. Minimal displacement. No additional acute bony abnormality. Joint spaces are maintained.  IMPRESSION: Right thumb tuft fracture.   Electronically Signed By: Charlett NoseKevin Dover M.D. On: 01/29/2014 04:43          MDM   Patient s/p blunt trauma to right thumb with subungual hematoma. We have decompressed (see above procedure note). Patient is feeling better. Accucheck is OK. Td is utd. Xray shows distal tuft fx. We will tx  with aluminum splint and refer to ortho for outpatient f/u.     Brandt LoosenJulie Manly, MD 01/29/14 [id number]

## 2014-01-29 NOTE — ED Notes (Signed)
Manly MD at bedside. 

## 2014-01-29 NOTE — ED Notes (Signed)
Pt slip and fell on thumb of right hand. He can wiggle digit, sensation present, cap refill less than 2 seconds. Some redness, swelling, and bruising noted. Skin warm and dry

## 2014-02-11 ENCOUNTER — Telehealth: Payer: Self-pay | Admitting: *Deleted

## 2014-02-11 NOTE — Telephone Encounter (Signed)
Spoke with pt. Pt needs to be seen to have paper work for work completed.  Pt coming in for appointment on 01/23/2014.

## 2014-02-20 ENCOUNTER — Encounter: Payer: Self-pay | Admitting: Endocrinology

## 2014-02-20 ENCOUNTER — Ambulatory Visit (INDEPENDENT_AMBULATORY_CARE_PROVIDER_SITE_OTHER): Payer: 59 | Admitting: Endocrinology

## 2014-02-20 VITALS — BP 122/82 | HR 66 | Temp 98.7°F | Ht 69.0 in | Wt 185.0 lb

## 2014-02-20 DIAGNOSIS — E109 Type 1 diabetes mellitus without complications: Secondary | ICD-10-CM

## 2014-02-20 LAB — BASIC METABOLIC PANEL
BUN: 18 mg/dL (ref 6–23)
CHLORIDE: 102 meq/L (ref 96–112)
CO2: 27 mEq/L (ref 19–32)
Calcium: 9.7 mg/dL (ref 8.4–10.5)
Creatinine, Ser: 1 mg/dL (ref 0.4–1.5)
GFR: 95.31 mL/min (ref >60.00)
Glucose, Bld: 125 mg/dL — ABNORMAL HIGH (ref 70–99)
Potassium: 4.2 mEq/L (ref 3.5–5.1)
Sodium: 137 mEq/L (ref 135–145)

## 2014-02-20 LAB — MICROALBUMIN / CREATININE URINE RATIO
CREATININE, U: 155.1 mg/dL
Microalb Creat Ratio: 0.3 mg/g (ref 0.0–30.0)
Microalb, Ur: 0.4 mg/dL (ref 0.0–1.9)

## 2014-02-20 LAB — HEMOGLOBIN A1C: Hgb A1c MFr Bld: 6.7 % — ABNORMAL HIGH (ref 4.6–6.5)

## 2014-02-20 NOTE — Progress Notes (Signed)
Subjective:    Patient ID: Andrew Ruiz, male    DOB: June 22, 1991, 23 y.o.   MRN: [id number]  HPI Pt returns for f/u of type 1 DM (dx'ed in august of 2014, when he presented with severe hyperglycemia (700); he has mild if any neuropathy of the lower extremities; no associated complications; he has an accucheck spirit insulin pump; he has never had severe hypoglycemia or DKA).  pt states he feels well in general.  In February of 2015, he had a 3-week episode when he did not need any insulin.  Since then, he has resumed pump therapy: basal rate of 0.65 units/hr, but he does not know the other settings.   He takes a total of approx 19 units per day.   Past Medical History  Diagnosis Date  . ADHD (attention deficit hyperactivity disorder)     [facility]  . Allergy   . Ventricular pre-excitation     on ekg card eval  no restrictions  . History of precordial chest pain   . Pilonidal abscess   . Diabetes mellitus without complication     No past surgical history on file.  History   Social History  . Marital Status: Single    Spouse Name: N/A    Number of Children: N/A  . Years of Education: N/A   Occupational History  . Not on file.   Social History Main Topics  . Smoking status: Never Smoker   . Smokeless tobacco: Not on file  . Alcohol Use: No  . Drug Use: No  . Sexual Activity: Not on file   Other Topics Concern  . Not on file   Social History Narrative   ** Merged History Encounter **       HH of 4   Pets Cats,dogs and bird      Was at [employer] 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    Lives alone in an apartment    Current Outpatient Prescriptions on File Prior to Visit  Medication Sig Dispense Refill  . ACCU-CHEK FASTCLIX LANCETS MISC       . glucagon (GLUCAGON EMERGENCY) 1 MG injection Inject 1 mg into the vein once as needed.  1  each  12  . glucose blood (ACCU-CHEK SMARTVIEW) test strip 8/day, and lancets 250.01  240 each  12  . HUMALOG 100 UNIT/ML injection FOR USE IN PUMP, TOTAL OF 60 UNITS PER DAY.  20 mL  1  . HYDROcodone-acetaminophen (NORCO/VICODIN) 5-325 MG per tablet Take 1-2 tablets by mouth every 4 (four) hours as needed.  8 tablet  0  . ibuprofen (ADVIL,MOTRIN) 600 MG tablet Take 1 tablet (600 mg total) by mouth every 6 (six) hours as needed.  30 tablet  0  . insulin lispro (HUMALOG) 100 UNIT/ML injection For use in pump, total of 50 units per day.      . Melatonin 5 MG TABS Take by mouth. PRN Sleep 1-2 tabs       No current facility-administered medications on file prior to visit.    No Known Allergies  Family History  Problem Relation Age of Onset  . 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  i agree he can return.

## 2014-02-20 NOTE — Patient Instructions (Addendum)
Please call or message us with your pump settings.  Please see Andrew Ruiz in our office, if you need pump help.   Please come back for a follow-up appointment in 3 months.   check your blood sugar 4 times a day--before the 3 meals, and at bedtime.  also check if you have symptoms of your blood sugar being too high or too low.  please keep a record of the readings and bring it to your next appointment here.  please call us sooner if your blood sugar goes below 70, or if you have a lot of readings over 200.  Blood and urine tests are being requested for you today.  We'll contact you with results.  Please get your eye exam done.  When i have that result, i'll complete the form.

## 2014-02-23 ENCOUNTER — Telehealth: Payer: Self-pay | Admitting: Endocrinology

## 2014-02-23 NOTE — Telephone Encounter (Signed)
Pt informed of labs and that form was faxed on 02/20/2014.

## 2014-02-23 NOTE — Telephone Encounter (Signed)
Would like results of his labs and status of form

## 2014-03-02 ENCOUNTER — Telehealth: Payer: Self-pay | Admitting: Endocrinology

## 2014-03-02 MED ORDER — GLUCOSE BLOOD VI STRP: ORAL_STRIP | Status: DC

## 2014-03-02 MED ORDER — INSULIN LISPRO 100 UNIT/ML ~~LOC~~ SOLN: SUBCUTANEOUS | Status: DC

## 2014-03-02 NOTE — Telephone Encounter (Signed)
Pt states he needs RX refill on Humalog and test strips   Call back:[phone]  Thank You :)

## 2014-03-02 NOTE — Telephone Encounter (Signed)
Done

## 2014-05-07 ENCOUNTER — Other Ambulatory Visit: Payer: Self-pay | Admitting: Endocrinology

## 2014-06-02 ENCOUNTER — Other Ambulatory Visit: Payer: Self-pay | Admitting: Endocrinology

## 2014-07-31 ENCOUNTER — Other Ambulatory Visit: Payer: Self-pay | Admitting: Endocrinology

## 2014-08-31 ENCOUNTER — Ambulatory Visit: Payer: Self-pay | Admitting: Endocrinology

## 2014-10-01 ENCOUNTER — Telehealth: Payer: Self-pay | Admitting: Endocrinology

## 2014-10-01 NOTE — Telephone Encounter (Signed)
[facility] # [phone]  Pt needs diabetic supplies for insulin pump pt has accucheck spirit combo  Pt needs cartridge system 3.15 mL and infusion set 8 mm needle and 80 cm tube  Please send  In soon please  And pt would like a refill of lantus and humalog pens for 30 days for pt when he is more active on weekends

## 2014-10-02 ENCOUNTER — Other Ambulatory Visit: Payer: Self-pay | Admitting: Endocrinology

## 2014-10-02 MED ORDER — ACCU-CHEK ULTRAFLEX INF SET MISC: 1.0000 | Status: DC

## 2014-10-02 NOTE — Telephone Encounter (Signed)
Ov is due.  We'll address then. 

## 2014-10-02 NOTE — Telephone Encounter (Signed)
Pt coming for office visit on 11/5.

## 2014-10-02 NOTE — Telephone Encounter (Signed)
Rx for Pump supplies has been refilled. Pt is requesting a refill on his lantus and I do not see that on his current medication list. Please advise Thanks!

## 2014-10-09 ENCOUNTER — Telehealth: Payer: Self-pay | Admitting: Endocrinology

## 2014-10-09 NOTE — Telephone Encounter (Signed)
Please call pt's pharmacy [facility] he needs his diabetic supplies and they are stating there is a discrepency. Please call the pt when this done

## 2014-10-09 NOTE — Telephone Encounter (Signed)
Please call pt's pharmacy [facility] he needs his diabetic supplies and they are stating there is a discrepency. Please call the pt when this done  °

## 2014-10-09 NOTE — Telephone Encounter (Signed)
Called pt and advised I contacted [facility]. They state they need office notes from the last 2 months to have supplies sent. Pt has not been seen since 02/20/2014. Offered to make office visit will pt was on the phone. Pt declined and stated he would call back and schedule.

## 2014-10-09 NOTE — Telephone Encounter (Signed)
Patient would like to know how do he go about getting his meds Lantus and Humalog quick pens put on his medication list,  Please advise

## 2014-10-16 ENCOUNTER — Encounter: Payer: Self-pay | Admitting: Endocrinology

## 2014-10-16 ENCOUNTER — Ambulatory Visit (INDEPENDENT_AMBULATORY_CARE_PROVIDER_SITE_OTHER): Payer: 59 | Admitting: Endocrinology

## 2014-10-16 VITALS — BP 134/80 | HR 70 | Temp 98.3°F | Ht 69.0 in | Wt 191.0 lb

## 2014-10-16 DIAGNOSIS — Z23 Encounter for immunization: Secondary | ICD-10-CM

## 2014-10-16 DIAGNOSIS — E109 Type 1 diabetes mellitus without complications: Secondary | ICD-10-CM

## 2014-10-16 LAB — HEMOGLOBIN A1C: Hgb A1c MFr Bld: 6.5 % (ref 4.6–6.5)

## 2014-10-16 MED ORDER — INSULIN LISPRO 100 UNIT/ML (KWIKPEN): PEN_INJECTOR | SUBCUTANEOUS | Status: DC

## 2014-10-16 MED ORDER — GLUCOSE BLOOD VI STRP: ORAL_STRIP | Status: DC

## 2014-10-16 MED ORDER — ACCU-CHEK ULTRAFLEX INF SET MISC: 1.0000 | Status: AC

## 2014-10-16 MED ORDER — INSULIN PUMP SYRINGE RESERVOIR MISC: 1.0000 | Status: AC

## 2014-10-16 MED ORDER — INSULIN GLARGINE 300 UNIT/ML ~~LOC~~ SOPN: 20.0000 [IU] | PEN_INJECTOR | Freq: Every day | SUBCUTANEOUS | Status: DC

## 2014-10-16 MED ORDER — ACCU-CHEK ULTRAFLEX INF SET MISC: 1.0000 | Status: DC

## 2014-10-16 NOTE — Progress Notes (Signed)
Pre visit review using our clinic review tool, if applicable. No additional management support is needed unless otherwise documented below in the visit note. 

## 2014-10-16 NOTE — Patient Instructions (Addendum)
Please come back for a follow-up appointment in 3 months.   check your blood sugar 8 times a day--before the 3 meals, and at bedtime.  also check if you have symptoms of your blood sugar being too high or too low.  please keep a record of the readings and bring it to your next appointment here.  please call us sooner if your blood sugar goes below 70, or if you have a lot of readings over 200.  Blood tests are equested for you today.  We'll contact you with results.  continue basal rate of 0.7 units/hr, 24 hrs per day.  continue mealtime bolus of 6-7 units per meal. Subtract a few units if exercise is upcoming. continue correction bolus (which some people call "sensitivity," or "insulin sensitivity ratio," or just "isr") of 1 unit for each by 30 which your glucose exceeds 120. i have sent a prescription to your pharmacy, for pens to use as needed.  Most people need 20-50% more than via the pump.   Try 20% first.   If you decide to get a continuous glucose monitor, please call and get appointment with Bonita QuinLinda

## 2014-10-16 NOTE — Progress Notes (Signed)
Subjective:    Patient ID: Andrew Ruiz, male    DOB: [birth year], 23 y.o.   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  Interval history:  He takes these pump settings:  basal rate of 0.65 units/hr, 24 hrs per day. He takes a mealtime bolus of 3-5 units. He takes a correction bolus of 1 unit for each 30 by which cbg exceeds 100.   He takes a total of approx 40 units per day.  He has had to increase insulin, due to hyperlgycemia.  He wants to take a general estimate of his mealtime bolus, rather than CHO counting.   Past Medical History  Diagnosis Date  . ADHD (attention deficit hyperactivity disorder)     [facility]  . Allergy   . Ventricular pre-excitation     on ekg card eval  no restrictions  . History of precordial chest pain   . Pilonidal abscess   . Diabetes mellitus without complication     History reviewed. No pertinent past surgical history.  History   Social History  . Marital Status: Single    Spouse Name: N/A    Number of Children: N/A  . Years of Education: N/A   Occupational History  . Not on file.   Social History Main Topics  . Smoking status: Never Smoker   . Smokeless tobacco: Not on file  . Alcohol Use: No  . Drug Use: No  . Sexual Activity: Not on file   Other Topics Concern  . Not on file   Social History Narrative   ** Merged History Encounter **       HH of 4   Pets Cats,dogs and bird      Was at [employer] 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    Lives alone in an apartment    Current Outpatient  Prescriptions on File Prior to Visit  Medication Sig Dispense Refill  . ACCU-CHEK FASTCLIX LANCETS MISC     . glucagon (GLUCAGON EMERGENCY) 1 MG injection Inject 1 mg into the vein once as needed. 1 each 12  . ibuprofen (ADVIL,MOTRIN) 600 MG tablet Take 1 tablet (600 mg total) by mouth every 6 (six) hours as needed. 30 tablet 0  . insulin lispro (HUMALOG) 100 UNIT/ML injection For use in pump, total of 50 units per day.    . Melatonin 5 MG TABS Take by mouth. PRN Sleep 1-2 tabs     No current facility-administered medications on file prior to visit.    No Known Allergies  Family History  Problem Relation Age of Onset  . ADD / ADHD Brother     BP 134/80 mmHg  Pulse 70  Temp(Src) 98.3 F (36.8 C) (Oral)  Ht 5\' 9"  (1.753 m)  Wt 191 lb (86.637 kg)  BMI 28.19 kg/m2  SpO2 97%  Review of Systems He has gained a few lbs.      Objective:   Physical Exam VITAL SIGNS:  See vs page GENERAL: no  distress Pulses: dorsalis pedis intact bilat.   Feet: no deformity.  no edema.   Skin:  no ulcer on the feet.  normal color and temp. Neuro: sensation is intact to touch on the feet.    Lab Results  Component Value Date   HGBA1C 6.5 10/16/2014      Assessment & Plan:  DM: well-controlled Weight gain: He is encouraged to re-lose. 50 minute ov.  More than 1/2 is counseling  Patient is advised the following: Patient Instructions  Please come back for a follow-up appointment in 3 months.   check your blood sugar 8 times a day--before the 3 meals, and at bedtime.  also check if you have symptoms of your blood sugar being too high or too low.  please keep a record of the readings and bring it to your next appointment here.  please call us sooner if your blood sugar goes below 70, or if you have a lot of readings over 200.  Blood tests are equested for you today.  We'll contact you with results.  continue basal rate of 0.7 units/hr, 24 hrs per day.  continue mealtime bolus of 6-7 units per  meal. Subtract a few units if exercise is upcoming. continue correction bolus (which some people call "sensitivity," or "insulin sensitivity ratio," or just "isr") of 1 unit for each by 30 which your glucose exceeds 120. i have sent a prescription to your pharmacy, for pens to use as needed.  Most people need 20-50% more than via the pump.   Try 20% first.   If you decide to get a continuous glucose monitor, please call and get appointment with Bonita QuinLinda    Addendum: please do not increase pump settings after all.

## 2014-10-19 ENCOUNTER — Telehealth: Payer: Self-pay

## 2014-10-19 ENCOUNTER — Telehealth: Payer: Self-pay | Admitting: Endocrinology

## 2014-10-19 MED ORDER — INSULIN GLARGINE 100 UNIT/ML SOLOSTAR PEN: 20.0000 [IU] | PEN_INJECTOR | Freq: Every day | SUBCUTANEOUS | Status: DC

## 2014-10-19 NOTE — Telephone Encounter (Signed)
Pt would like his lab results  Pts toujeo is not going to be covered is there an alternate

## 2014-10-19 NOTE — Telephone Encounter (Signed)
Contacted pt and advised that a1c was 6.5 and that his pump setting did not need to be adjusted. Pt states that he started the new pump settings on Saturday and he states that he has been feeling better and would like to continue with these settings for now. Pt advised Lantus was sent as an alternative to Toujeo. Pt states he will call if his sugars become too high or too low.

## 2014-10-19 NOTE — Telephone Encounter (Signed)
Received a note from pt's pharamacy stating that toujeo is not covered my insurance company.  Please advise, Thanks!

## 2014-10-19 NOTE — Telephone Encounter (Signed)
Ok, i changed to lantus

## 2014-10-19 NOTE — Telephone Encounter (Signed)
ok 

## 2015-01-26 ENCOUNTER — Other Ambulatory Visit: Payer: Self-pay | Admitting: Endocrinology

## 2015-04-03 ENCOUNTER — Other Ambulatory Visit: Payer: Self-pay | Admitting: Endocrinology

## 2015-04-14 ENCOUNTER — Telehealth: Payer: Self-pay | Admitting: Endocrinology

## 2015-04-14 NOTE — Telephone Encounter (Signed)
Patient wanted to know the amount of Humalog recommended to take and if he can start his prednisone.  Thanks!

## 2015-04-14 NOTE — Telephone Encounter (Signed)
Patient coming in for a appointment tomorrow at 815 am.

## 2015-04-14 NOTE — Telephone Encounter (Signed)
See note below. I contacted the patient. He stated he went in yesterday at 6pm and had the steroid shot. Pt stated since then his sugar has been in the upper 200 and last night before bed was 390. Pt stated over the course of the evening he took around 30 units of the Humalog. Pt stated he was given prednisone but he has not started taking the medication because he is afraid his blood sugars will rise more. Pt stated he is worried he may have taken to much insulin last evening and his sugar is going to drop.  Please advise, Thanks!

## 2015-04-14 NOTE — Telephone Encounter (Signed)
Don't worry, the humalog is gone in just a few hrs. Please continue to take extra humalog as needed for high blood sugar F/u ov is due

## 2015-04-14 NOTE — Telephone Encounter (Signed)
Patiert stated he went to urgent care for poison Andrew Ruiz, they gave him a steroid shot and his b/s is out of wack, please advise

## 2015-04-14 NOTE — Telephone Encounter (Signed)
Ov tomorrow.  We'll address

## 2015-04-15 ENCOUNTER — Telehealth: Payer: Self-pay | Admitting: Endocrinology

## 2015-04-15 ENCOUNTER — Encounter: Payer: Self-pay | Admitting: Endocrinology

## 2015-04-15 ENCOUNTER — Telehealth: Payer: Self-pay | Admitting: Internal Medicine

## 2015-04-15 ENCOUNTER — Ambulatory Visit (INDEPENDENT_AMBULATORY_CARE_PROVIDER_SITE_OTHER): Payer: Worker's Compensation | Admitting: Endocrinology

## 2015-04-15 VITALS — BP 138/92 | HR 69 | Temp 97.9°F | Ht 69.0 in | Wt 199.0 lb

## 2015-04-15 DIAGNOSIS — E109 Type 1 diabetes mellitus without complications: Secondary | ICD-10-CM

## 2015-04-15 DIAGNOSIS — R209 Unspecified disturbances of skin sensation: Secondary | ICD-10-CM

## 2015-04-15 LAB — HEMOGLOBIN A1C: HEMOGLOBIN A1C: 6.9 % — AB (ref 4.6–6.5)

## 2015-04-15 LAB — VITAMIN B12: Vitamin B-12: 487 pg/mL (ref 211–911)

## 2015-04-15 MED ORDER — INSULIN LISPRO 100 UNIT/ML ~~LOC~~ SOLN: SUBCUTANEOUS | Status: DC

## 2015-04-15 MED ORDER — GLUCOSE BLOOD VI STRP: ORAL_STRIP | Status: DC

## 2015-04-15 NOTE — Progress Notes (Signed)
Subjective:    Patient ID: Andrew Ruiz, male    DOB: [birth year], 24 y.o.   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   Interval history:  He takes these pump settings:  basal rate of 0.7 units/hr, 24 hrs per day. mealtime bolus of 3-5 units, depending on the size of the meal (he perfers to take a general estimate of his mealtime bolus, rather than CHO counting) correction bolus of 1 unit for each 30 by which cbg exceeds 100.   He takes a total of approx 40 units per day.  He frequently goes off the pump, and takes multiple daily injections, on days he does not work.  He had a steroid injection into the right hip 2 days ago.  Since then, cbg's have been as high as 400.  Since then, he has been taking extra boluses.   He has few weeks of intermittent slight numbness of the hands, but no assoc pain.   Past Medical History  Diagnosis Date  . ADHD (attention deficit hyperactivity disorder)     [facility]  . Allergy   . Ventricular pre-excitation     on ekg card eval  no restrictions  . History of precordial chest pain   . Pilonidal abscess   . Diabetes mellitus without complication     No past surgical history on file.  History   Social History  . Marital Status: Single    Spouse Name: N/A  . Number of Children: N/A  . Years of Education: N/A   Occupational History  . Not on file.   Social History Main Topics  . Smoking status: Never Smoker   . Smokeless tobacco: Not on file  . Alcohol Use: No  . Drug Use: No  . Sexual Activity: Not on file   Other Topics Concern  . Not on file   Social History Narrative   ** Merged History Encounter **       HH of 4   Pets Cats,dogs and bird      Was at [employer] 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    Lives alone in an apartment    Current Outpatient Prescriptions on File Prior to Visit  Medication Sig Dispense Refill  . ACCU-CHEK FASTCLIX LANCETS MISC     . glucagon (GLUCAGON EMERGENCY) 1 MG injection Inject 1 mg into the vein once as needed. 1 each 12  . ibuprofen (ADVIL,MOTRIN) 600 MG tablet Take 1 tablet (600 mg total) by mouth every 6 (six) hours as needed. 30 tablet 0  . Insulin Infusion Pump Supplies (INSULIN PUMP SYRINGE RESERVOIR) MISC 1 Device by Does not apply route every 3 (three) days. 30 each 3  . Insulin Infusion Pump Supplies (ULTRAFLEX) MISC 1 Device by Does not apply route every 3 (three) days. 80 cm tube length, 8 mm catheter length, ref # [id number] 10 each 1  . Melatonin 5 MG TABS Take by mouth. PRN Sleep 1-2 tabs     No current  facility-administered medications on file prior to visit.    No Known Allergies  Family History  Problem Relation Age of Onset  . ADD / ADHD Brother     BP 138/92 mmHg  Pulse 69  Temp(Src) 97.9 F (36.6 C) (Oral)  Ht 5\' 9"  (1.753 m)  Wt 199 lb (90.266 kg)  BMI 29.37 kg/m2  SpO2 94%  Review of Systems He denies hypoglycemia.  Hip pain is improved. He has mild poison ivy rash also.    Objective:   Physical Exam VITAL SIGNS:  See vs page GENERAL: no distress Pulses: dorsalis pedis intact bilat.   MSK: no deformity of the feet CV: no leg edema Skin:  no ulcer on the feet.  normal color and temp on the feet. Neuro: sensation is intact to touch on the feet   Lab Results  Component Value Date   HGBA1C 6.9* 04/15/2015       Assessment & Plan:  DM: well-controlled, despite steroids Acral numbness, new ? CTS  Patient is advised the following: Patient Instructions  You can take the correction bolus as often as  every 2 hrs, if necessary. The effect of the steroid shot will last another few days. Please come back for a follow-up appointment in 3 months.   check your blood sugar 8 times a day--before the 3 meals, and at bedtime.  also check if you have symptoms of your blood sugar being too high or too low.  please keep a record of the readings and bring it to your next appointment here.  please call us sooner if your blood sugar goes below 70, or if you have a lot of readings over 200.  Blood tests are requested for you today.  We'll contact you with results.   continue basal rate of 0.7 units/hr, 24 hrs per day.   continue mealtime bolus of 3-5 units per meal. Subtract a few units if exercise is upcoming.  continue correction bolus (which some people call "sensitivity," or "insulin sensitivity ratio," or just "isr") of 1 unit for each by 30 which your glucose exceeds 120.  If you decide to get a continuous glucose monitor, please call and get appointment with Bonita QuinLinda.   Please hold off on the prednisone, so we can get your blood sugar better.  Please call Dr Fabian SharpPanosh if the hip pain recurs.   It is better to use the pump all the time.  Please try doing so if you can.   addendum: i ordered PNCV and EMG of LE's.

## 2015-04-15 NOTE — Patient Instructions (Addendum)
You can take the correction bolus as often as every 2 hrs, if necessary. The effect of the steroid shot will last another few days. Please come back for a follow-up appointment in 3 months.   check your blood sugar 8 times a day--before the 3 meals, and at bedtime.  also check if you have symptoms of your blood sugar being too high or too low.  please keep a record of the readings and bring it to your next appointment here.  please call Andrew Ruiz sooner if your blood sugar goes below 70, or if you have a lot of readings over 200.  Blood tests are requested for you today.  We'll contact you with results.   continue basal rate of 0.7 units/hr, 24 hrs per day.   continue mealtime bolus of 3-5 units per meal. Subtract a few units if exercise is upcoming.  continue correction bolus (which some people call "sensitivity," or "insulin sensitivity ratio," or just "isr") of 1 unit for each by 30 which your glucose exceeds 120.  If you decide to get a continuous glucose monitor, please call and get appointment with Andrew Ruiz.   Please hold off on the prednisone, so we can get your blood sugar better.  Please call Dr Andrew Ruiz if the hip pain recurs.   It is better to use the pump all the time.  Please try doing so if you can.

## 2015-04-15 NOTE — Telephone Encounter (Signed)
Patient is calling for the results of his labs.

## 2015-04-15 NOTE — Telephone Encounter (Signed)
I contacted the patient and advised him of his lab results.

## 2015-04-15 NOTE — Telephone Encounter (Signed)
FYI.  Pt called initially (2330). States received IV solumedrol 04/13/15 pm - for rash.  Concern regarding sugar elevation.  Has insulin pump.  Is concerned because his blood sugar now is 324.  He has been giving himself frequent boluses of insulin to get his sugar down.  Gave himself 10 units at 1:52, 8 units at 6:58pm, 10 units at 10:32, 3 units at 10:37, 2 units at 11:00 and 8 Units at 11:18.  Has had 23 units of insulin since 10:30 pm. Other than being thirsty and increased urination, states he feels ok.  Has been eating and drinking today.  No nausea or vomiting.   Instructed not to give more boluses this pm.  Stay hydrated.  He usually works nights.  He called out of work [employer] manag[employer]ight.  Instructed to stay awake and recheck blood sugar within the next 1.5-2 hours.    Called back approximately 230 and informed me sugar had decreased to 125.  He had already drank mild and ate some frosting.  States he was afraid it was dropping too fast.  Rechecked sugar while on phone - 138.  He will continue to monitor sugars.  Hold on more snacking (unless needed).    Called back at 330.  Blood sugar ranging 180-210.    Checked on pt at 0500.  Blood sugar - 259.  Will keep appt this am with Dr Everardo AllEllison.

## 2015-04-16 ENCOUNTER — Telehealth: Payer: Self-pay | Admitting: *Deleted

## 2015-04-16 ENCOUNTER — Ambulatory Visit: Payer: Self-pay | Admitting: Internal Medicine

## 2015-04-16 ENCOUNTER — Ambulatory Visit: Payer: Self-pay | Admitting: Endocrinology

## 2015-04-16 MED ORDER — TRIAMCINOLONE ACETONIDE 0.1 % EX CREA: 1.0000 "application " | TOPICAL_CREAM | Freq: Four times a day (QID) | CUTANEOUS | Status: DC

## 2015-04-16 NOTE — Telephone Encounter (Signed)
[facility] - Client TELEPHONE ADVICE RECORD [facility] Patient Name: Andrew Ruiz Gender: Male DOB: 28-Sep-1991 Age: 24 Y 2 M 22 D Return Phone Number: [phone] (Primary) Address: [address] City[place]State[place]Zip: [place] [address] Client [facility] - Client Client Site [facility] Night Contact Type Call Call Type Triage / Clinical Relationship To Patient Self Return Phone Number [phone] (Primary) Chief Complaint Blood Sugar High Initial Comment Caller states has poison ivy and had to get steriod shot. his blood sugar is running in the 200-300 ranges and wants to know how to get his blood sugar down PreDisposition Call Doctor Nurse Assessment Nurse: Alben SpittleWeaver, RN, Arline Aspindy Date/Time Andrew Ruiz(Eastern Time): 04/14/2015 6:05:11 AM Confirm and document reason for call. If symptomatic, describe symptoms. ---Caller states he had to get a steroid shot. Now his sugar 305. Has the patient traveled out of the country within the last 30 days? ---No Does the patient require triage? ---Yes Related visit to physician within the last 2 weeks? ---Yes Does the PT have any chronic conditions? (i.e. diabetes, asthma, etc.) ---Yes List chronic conditions. ---Diabetes Guidelines Guideline Title Affirmed Question Affirmed Notes Nurse Date/Time (Eastern Time) Diabetes - High Blood Sugar [1] Blood glucose > 300 mg/dl (13.016.5 mmol/l) AND [8][2] two or more times in a row BethelWeaver, RN, [facility] LPCindy 04/14/2015 6:08:25 AM Disp. Time Andrew Ruiz(Eastern Time) Disposition Final User 04/14/2015 6:14:10 AM P[facility] Weaver, RN, Arline AspCindy 04/14/2015 6:24:15 AM Clinical Call Alben SpittleWeaver, RN, Arline Aspindy 04/14/2015 6:11:40 AM Call PCP Now Yes Alben SpittleWeaver, RN, Ave Filterindy Caller Understands: Yes Disagree/Comply: Comply PLEASE NOTE: All timestamps contained within this report are represented as [place] Standard Time. CONFIDENTIALTY NOTICE: This fax  transmission is intended only for the addressee. It contains information that is legally privileged, confidential or otherwise protected from use or disclosure. If you are not the intended recipient, you are strictly prohibited from reviewing, disclosing, copying using or disseminating any of this information or taking any action in reliance on or regarding this information. If you have received this fax in error, please notify us immediately by telephone so that we can arrange for its return to us. Phone: [phone], Toll-Free: [phone], Fax: [phone] Page: 2 of 2 Call Id: [id number] Care Advice Given Per Guideline CALL PCP NOW: You need to discuss this with your doctor. I'll page him now. If you haven't heard from the on-call doctor within 30 minutes, call again. CARE ADVICE given per Diabetes - High Blood Sugar (Adult) guideline. * You become worse. CALL BACK IF: After Care Instructions Given Call Event Type User Date / Time Description Referrals REFERRED TO PCP OFFICE Paging DoctorName Phone DateTime Result/Outcome Message Type Notes Ronna PolioWalker, Jennifer [phone] 04/14/2015 6:14:10 AM Paged On Call Back to Call Center Doctor Paged Call Milfordindy at the call center [phone] Ronna PolioWalker, Jennifer 04/14/2015 6:23:35 AM Spoke with On Call - General Message Result Spoke with Dr. Dan HumphreysWalker she said to tell patient not to take anymore humalog because it may drop the sugar too much. Follow-up with office today. Spoke with Andrew Huaavid, the pt and he said he would call them this morning when they open.

## 2015-04-19 ENCOUNTER — Telehealth: Payer: Self-pay | Admitting: Endocrinology

## 2015-04-19 MED ORDER — TRIAMCINOLONE ACETONIDE 0.1 % EX CREA: 1.0000 "application " | TOPICAL_CREAM | Freq: Four times a day (QID) | CUTANEOUS | Status: DC

## 2015-04-19 NOTE — Telephone Encounter (Signed)
See note from [facility].  

## 2015-04-19 NOTE — Telephone Encounter (Signed)
i refilled.  Please call pcp if persists.

## 2015-04-19 NOTE — Telephone Encounter (Signed)
[facility] note dated 04/19/15 8:31am  Caller states has poison ivy; got steroid shot which did not go well; was given RX for cream; wants another RX of triamcinolone 0.1% cream [facility] to [facility] [address], [phone].  Verbal Orders/Maintenance Medications Medication Refill Route Dosage Regime Duration Admin Instructions User Name Triamcinalone Cream .1%, apply topical qid prn to poison ivey rash, #1 80Gm tube, No refills. N/A Carmon, RN, Denise Comments User: Greggory Stallionenise, Carmon, RN Date/Time [name](Eastern Time): 04/18/2015 9:41:36 AM Returned call to the pt and advised Triamcinalone [facility]. Verb understanding. Paging DoctorName Phone DateTime Result/Outcome Message Type Notes Santiago BumpersMcGowen, Phil [phone] 04/18/2015 8:36:48 AM Paged On Call Back to Call Center Doctor Paged [phone]/[facility] Santiago BumpersMcGowen, Phil [phone] 04/18/2015 9:16:50 AM Paged On Call Back to Call Center Doctor Paged [phone] Santiago BumpersMcGowen, Phil 04/18/2015 9:32:10 AM Spoke with On Call - General Message Result Pt report given. VO recieved: Triamcinalone Cream 1%, apply topical qid prn. #1 tube, No refills.

## 2015-05-03 ENCOUNTER — Other Ambulatory Visit: Payer: Self-pay | Admitting: *Deleted

## 2015-05-03 DIAGNOSIS — R209 Unspecified disturbances of skin sensation: Secondary | ICD-10-CM

## 2015-05-25 ENCOUNTER — Encounter: Payer: Self-pay | Admitting: Neurology

## 2015-05-26 ENCOUNTER — Ambulatory Visit (INDEPENDENT_AMBULATORY_CARE_PROVIDER_SITE_OTHER): Payer: 59 | Admitting: Neurology

## 2015-05-26 DIAGNOSIS — R209 Unspecified disturbances of skin sensation: Secondary | ICD-10-CM

## 2015-05-26 NOTE — Procedures (Signed)
[facility]  [address] [place], [address]  [place], [place] [address] Tel: [phone] Fax:  [phone] Test Date:  05/26/2015  Patient: Andrew Ruiz, Andrew Ruiz DOB: 08/04/1991 Physician: Nita Sickle  Sex: Male Height: 5\' 9"  Ref Phys: Romero Belling  ID#: [id number] Temp: 33.1C Technician:    Patient Complaints: This is a 24 year-old gentleman presenting for evaluation of intermittent bilateral numbness/tingling.  NCV & EMG Findings: Extensive electrodiagnostic testing of the left upper extremity shows:  1. Left median, ulnar, and palmar studies are within normal limits. 2. Left median and ulnar motor responses are within normal limits. 3. There is no evidence of active or chronic motor axon loss changes affecting any of the tested muscles. Motor unit configuration and recruitment pattern is normal.  Impression: This is a normal study of the left upper extremity. In particular, there is no evidence of carpal tunnel syndrome or cervical radiculopathy.   Per patient's request, electrodiagnostic testing of the right upper extremity was not performed due to intolerance of testing.   ___________________________ Nita Sickle    Nerve Conduction Studies Anti Sensory Summary Table   Stim Site NR Peak (ms) Norm Peak (ms) P-T Amp (V) Norm P-T Amp  Left Median Anti Sensory (2nd Digit)  Wrist    3.0 <3.3 43.3 >20  Left Ulnar Anti Sensory (5th Digit)  Wrist    3.0 <3.0 42.3 >18   Motor Summary Table   Stim Site NR Onset (ms) Norm Onset (ms) O-P Amp (mV) Norm O-P Amp Site1 Site2 Delta-0 (ms) Dist (cm) Vel (m/s) Norm Vel (m/s)  Left Median Motor (Abd Poll Brev)  Wrist    3.1 <3.9 11.1 >6 Elbow Wrist 5.2 30.0 58 >51  Elbow    8.3  11.1         Left Ulnar Motor (Abd Dig Minimi)  Wrist    2.6 <3.0 11.2 >8 B Elbow Wrist 4.4 24.0 55 >51  B Elbow    7.0  10.1  A Elbow B Elbow 1.9 10.0 53 >51  A Elbow    8.9  9.8          Comparison Summary Table   Stim Site NR Peak (ms) Norm Peak  (ms) P-T Amp (V) Site1 Site2 Delta-P (ms) Norm Delta (ms)  Left Median/Ulnar Palm Comparison (Wrist - 8cm)  Median Palm    1.8 <2.2 85.9 Median Palm Ulnar Palm 0.1   Ulnar Palm    1.7 <2.2 21.6       EMG   Side Muscle Ins Act Fibs Psw Fasc Number Recrt Dur Dur. Amp Amp. Poly Poly. Comment  Left 1stDorInt Nml Nml Nml Nml Nml Nml Nml Nml Nml Nml Nml Nml N/A  Left Ext Indicis Nml Nml Nml Nml Nml Nml Nml Nml Nml Nml Nml Nml N/A  Left PronatorTeres Nml Nml Nml Nml Nml Nml Nml Nml Nml Nml Nml Nml N/A  Left Biceps Nml Nml Nml Nml Nml Nml Nml Nml Nml Nml Nml Nml N/A  Left Triceps Nml Nml Nml Nml Nml Nml Nml Nml Nml Nml Nml Nml N/A  Left Deltoid Nml Nml Nml Nml Nml Nml Nml Nml Nml Nml Nml Nml N/A      Waveforms:

## 2015-06-21 ENCOUNTER — Other Ambulatory Visit: Payer: Self-pay | Admitting: Endocrinology

## 2015-07-19 ENCOUNTER — Emergency Department (HOSPITAL_COMMUNITY)
Admission: EM | Admit: 2015-07-19 | Discharge: 2015-07-19 | Discharge status: Against Medical Advice | Payer: Worker's Compensation | Attending: Emergency Medicine | Admitting: Emergency Medicine

## 2015-07-19 DIAGNOSIS — Z0000 Encounter for general adult medical examination without abnormal findings: Secondary | ICD-10-CM | POA: Diagnosis not present

## 2015-07-19 DIAGNOSIS — E119 Type 2 diabetes mellitus without complications: Secondary | ICD-10-CM | POA: Diagnosis not present

## 2015-07-19 NOTE — ED Notes (Signed)
Bed: WA12 Expected date:  Expected time:  Means of arrival:  Comments: Pt in room 

## 2015-07-21 ENCOUNTER — Ambulatory Visit: Payer: Self-pay | Admitting: Endocrinology

## 2015-07-28 ENCOUNTER — Encounter: Payer: Self-pay | Admitting: Endocrinology

## 2015-07-28 ENCOUNTER — Ambulatory Visit (INDEPENDENT_AMBULATORY_CARE_PROVIDER_SITE_OTHER): Payer: 59 | Admitting: Endocrinology

## 2015-07-28 VITALS — BP 124/76 | HR 73 | Temp 98.1°F | Ht 69.0 in | Wt 201.0 lb

## 2015-07-28 DIAGNOSIS — E109 Type 1 diabetes mellitus without complications: Secondary | ICD-10-CM

## 2015-07-28 MED ORDER — INSULIN LISPRO 100 UNIT/ML (KWIKPEN): PEN_INJECTOR | SUBCUTANEOUS | Status: DC

## 2015-07-28 MED ORDER — INSULIN GLARGINE 100 UNIT/ML SOLOSTAR PEN: 25.0000 [IU] | PEN_INJECTOR | Freq: Every day | SUBCUTANEOUS | Status: DC

## 2015-07-28 NOTE — Progress Notes (Signed)
Subjective:    Patient ID: Andrew Ruiz, male    DOB: 1991-06-18, 24 y.o.   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   Interval history:  He takes these pump settings:  basal rate of 0.7 units/hr, 24 hrs per day. mealtime bolus of 3-5 units, depending on the size of the meal (he prefers to take a general estimate of his mealtime bolus, rather than CHO counting) correction bolus of 1 unit for each 30 by which cbg exceeds 100.   He takes a total of approx 40 units per day.  He now uses the pump almost all the time.   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 Past Medical History  Diagnosis Date  . ADHD (attention deficit hyperactivity disorder)     [facility]  . Allergy   . Ventricular pre-excitation     on ekg card eval  no restrictions  . History of precordial chest pain   . Pilonidal abscess   . Diabetes mellitus without complication     No past surgical history on file.  Social History   Social History  . Marital Status: Single    Spouse Name: N/A  . Number of Children: N/A  . Years of Education: N/A   Occupational History  . Not on file.   Social History Main Topics  . Smoking status: Never Smoker   . Smokeless tobacco: Not on file  . Alcohol Use: No  . Drug Use: No  . Sexual Activity: Not on file   Other Topics Concern  . Not on file   Social History Narrative   ** Merged History Encounter **       HH of 4   Pets Cats,dogs and bird      Was at [employer]. 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    Lives alone in an apartment    Current Outpatient Prescriptions on File Prior to Visit  Medication Sig Dispense Refill  . ACCU-CHEK FASTCLIX LANCETS MISC     . glucagon (GLUCAGON EMERGENCY) 1 MG injection Inject 1 mg into the vein once as needed. 1 each 12  . glucose blood (ACCU-CHEK AVIVA) test strip Test 8 times per day, and lancets 240 each 12  . HUMALOG 100 UNIT/ML injection FOR USE IN PUMP, TOTAL OF 60 UNITS PER DAY. 20 mL 1  . ibuprofen (ADVIL,MOTRIN) 600 MG tablet Take 1 tablet (600 mg total) by mouth every 6 (six) hours as needed. 30 tablet 0  . Insulin Infusion Pump Supplies (INSULIN PUMP SYRINGE RESERVOIR) MISC 1 Device by Does not apply route every 3 (three) days. 30 each 3  . Insulin Infusion Pump Supplies (ULTRAFLEX) MISC 1 Device by Does not apply route every 3 (three) days. 80 cm tube length, 8 mm catheter length, ref # [id number] 10 each 1  . Melatonin 5 MG TABS Take by mouth. PRN Sleep 1-2 tabs    . triamcinolone cream (KENALOG)  0.1 % Apply 1 application topically 4 (four) times daily. As needed for itching 80 g 0   No current facility-administered medications on file prior to visit.    No Known Allergies  Family History  Problem Relation Age of Onset  . ADD / ADHD Brother     BP 124/76 mmHg  Pulse 73  Temp(Src) 98.1 F (36.7 C) (Oral)  Ht  (1.753 m)  Wt 201 lb (91.173 kg)  BMI 29.67 kg/m2  SpO2 97%  Review of Systems Denies LOC    Objective:   Physical Exam VITAL SIGNS:  See vs page GENERAL: no distress Pulses: dorsalis pedis intact bilat.   MSK: no deformity of the feet CV: no leg edema Skin:  no ulcer on the feet.  normal color and temp on the feet. Neuro: sensation is intact to touch on the feet  A1c=6.4% I discussed with Cristy Folks, RN    Assessment & Plan:  DM: well-controlled Occupational status: this is complicating the rx of DM.  i wrote letter for patient.    Patient is advised the  following: Patient Instructions  Please come back for a follow-up appointment in 3 months.   check your blood sugar 8 times a day--before the 3 meals, and at bedtime.  also check if you have symptoms of your blood sugar being too high or too low.  please keep a record of the readings and bring it to your next appointment here.  please call us sooner if your blood sugar goes below 70, or if you have a lot of readings over 200.  continue basal rate of 0.7 units/hr, 24 hrs per day.   continue mealtime bolus of 3-5 units per meal. Subtract a few units if exercise is upcoming.   continue correction bolus (which some people call "sensitivity," or "insulin sensitivity ratio," or just "isr") of 1 unit for each by 30 which your glucose exceeds 120.  Please see Bonita Quin, to consider a continuous glucose monitor.   i have sent a prescription to your pharmacy, for lantus and humalog pens, in case you have to go off the pump.

## 2015-07-28 NOTE — Patient Instructions (Addendum)
Please come back for a follow-up appointment in 3 months.   check your blood sugar 8 times a day--before the 3 meals, and at bedtime.  also check if you have symptoms of your blood sugar being too high or too low.  please keep a record of the readings and bring it to your next appointment here.  please call us sooner if your blood sugar goes below 70, or if you have a lot of readings over 200.  continue basal rate of 0.7 units/hr, 24 hrs per day.   continue mealtime bolus of 3-5 units per meal. Subtract a few units if exercise is upcoming.   continue correction bolus (which some people call "sensitivity," or "insulin sensitivity ratio," or just "isr") of 1 unit for each by 30 which your glucose exceeds 120.  Please see Bonita Quin, to consider a continuous glucose monitor.   i have sent a prescription to your pharmacy, for lantus and humalog pens, in case you have to go off the pump.

## 2015-08-06 ENCOUNTER — Telehealth: Payer: Self-pay | Admitting: Endocrinology

## 2015-08-06 LAB — HM DIABETES EYE EXAM

## 2015-08-06 NOTE — Telephone Encounter (Signed)
Pt called about his needles for his pin but he has a question if they are really working please call pt back at [phone]

## 2015-08-06 NOTE — Telephone Encounter (Signed)
Pt called about his pen needles. Pt stated he has never use the nano needles before and wanted to know if any issues had been reported with him. I advised him no issues had been reported to me and offered to send a different type. Pt declined at this time.

## 2015-08-13 ENCOUNTER — Telehealth: Payer: Self-pay | Admitting: Endocrinology

## 2015-08-13 NOTE — Telephone Encounter (Signed)
I contacted [facility]. Representative stated they needed to verify the prescribing MD. Info given.

## 2015-08-13 NOTE — Telephone Encounter (Signed)
Rep from [facility] left a message regarding pt's cartridges and infusion sets please call back at [phone]

## 2015-10-14 ENCOUNTER — Encounter: Payer: Self-pay | Admitting: Endocrinology

## 2015-10-18 ENCOUNTER — Encounter: Payer: Self-pay | Admitting: Endocrinology

## 2015-10-18 ENCOUNTER — Encounter: Payer: 59 | Attending: Endocrinology | Admitting: Nutrition

## 2015-10-18 ENCOUNTER — Other Ambulatory Visit: Payer: Self-pay

## 2015-10-18 ENCOUNTER — Ambulatory Visit (INDEPENDENT_AMBULATORY_CARE_PROVIDER_SITE_OTHER): Payer: 59 | Admitting: Endocrinology

## 2015-10-18 VITALS — BP 137/84 | HR 64 | Temp 98.0°F | Ht 69.0 in | Wt 201.0 lb

## 2015-10-18 DIAGNOSIS — E109 Type 1 diabetes mellitus without complications: Secondary | ICD-10-CM | POA: Diagnosis not present

## 2015-10-18 DIAGNOSIS — Z23 Encounter for immunization: Secondary | ICD-10-CM

## 2015-10-18 LAB — POCT GLYCOSYLATED HEMOGLOBIN (HGB A1C): Hemoglobin A1C: 6.6

## 2015-10-18 MED ORDER — GLUCOSE BLOOD VI STRP: ORAL_STRIP | Status: DC

## 2015-10-18 NOTE — Patient Instructions (Addendum)
Please come back for a follow-up appointment in 3 months.   check your blood sugar 8 times a day--before the 3 meals, and at bedtime.  also check if you have symptoms of your blood sugar being too high or too low.  please keep a record of the readings and bring it to your next appointment here.  please call us sooner if your blood sugar goes below 70, or if you have a lot of readings over 200.  continue basal rate of 0.7 units/hr, 24 hrs per day.   continue mealtime bolus of 3-5 units per meal. Subtract a few units if exercise is upcoming.   continue correction bolus (which some people call "sensitivity," or "insulin sensitivity ratio," or just "isr") of 1 unit for each by 30 which your glucose exceeds 120.  Please see Bonita QuinLinda today, to get a continuous glucose monitor.

## 2015-10-18 NOTE — Progress Notes (Signed)
Subjective:    Patient ID: Andrew Ruiz, male    DOB: Mar 22, 1991, 24 y.o.   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   Interval history:  He takes these pump settings:  basal rate of 0.7 units/hr, 24 hrs per day. mealtime bolus of 3-5 units, depending on the size of the meal (he prefers to take a general estimate of his mealtime bolus, rather than CHO counting) correction bolus of 1 unit for each 30 by which cbg exceeds 100.   He takes a total of approx 40 units per day.  He seldom has hypoglcemia, and these episodes are mild.  pt states he feels well in general. Past Medical History  Diagnosis Date  . ADHD (attention deficit hyperactivity disorder)     [facility]  . Allergy   . Ventricular pre-excitation     on ekg card eval  no restrictions  . History of precordial chest pain   . Pilonidal abscess   . Diabetes mellitus without complication (HCC)     No past surgical history on file.  Social History   Social History  . Marital Status: Single    Spouse Name: N/A  . Number of Children: N/A  . Years of Education: N/A   Occupational History  . Not on file.   Social History Main Topics  . Smoking status: Never Smoker   . Smokeless tobacco: Not on file  . Alcohol Use: No  . Drug Use: No  . Sexual Activity: Not on file   Other Topics Concern  . Not on file   Social History Narrative   ** Merged History Encounter **       HH of 4   Pets Cats,dogs and bird      Was at [employer]. 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    Lives alone  in an apartment    Current Outpatient Prescriptions on File Prior to Visit  Medication Sig Dispense Refill  . ACCU-CHEK FASTCLIX LANCETS MISC     . glucagon (GLUCAGON EMERGENCY) 1 MG injection Inject 1 mg into the vein once as needed. 1 each 12  . HUMALOG 100 UNIT/ML injection FOR USE IN PUMP, TOTAL OF 60 UNITS PER DAY. 20 mL 1  . ibuprofen (ADVIL,MOTRIN) 600 MG tablet Take 1 tablet (600 mg total) by mouth every 6 (six) hours as needed. 30 tablet 0  . Insulin Infusion Pump Supplies (INSULIN PUMP SYRINGE RESERVOIR) MISC 1 Device by Does not apply route every 3 (three) days. 30 each 3  . Insulin Infusion Pump Supplies (ULTRAFLEX) MISC 1 Device by Does not apply route every 3 (three) days. 80 cm tube length, 8 mm catheter length, ref # [id number] 10 each 1  . Melatonin 5 MG TABS Take by mouth. PRN Sleep 1-2 tabs    . triamcinolone cream (KENALOG) 0.1 % Apply 1 application topically 4 (four) times daily. As needed for itching 80 g 0   No current facility-administered medications on file prior to visit.  No Known Allergies  Family History  Problem Relation Age of Onset  . ADD / ADHD Brother     BP 137/84 mmHg  Pulse 64  Temp(Src) 98 F (36.7 C) (Oral)  Ht  (1.753 m)  Wt 201 lb (91.173 kg)  BMI 29.67 kg/m2  SpO2 96%  Review of Systems Denies LOC    Objective:   Physical Exam VITAL SIGNS:  See vs page GENERAL: no distress Pulses: dorsalis pedis intact bilat.   MSK: no deformity of the feet CV: no leg edema Skin:  no ulcer on the feet.  normal color and temp on the feet. Neuro: sensation is intact to touch on the feet   A1c=6.6%    Assessment & Plan:  DM: well-controlled  Patient is advised the following: Patient Instructions  Please come back for a follow-up appointment in 3 months.   check your blood sugar 8 times a day--before the 3 meals, and at bedtime.  also check if you have symptoms of your blood sugar being too high or too low.  please keep a record of  the readings and bring it to your next appointment here.  please call us sooner if your blood sugar goes below 70, or if you have a lot of readings over 200.  continue basal rate of 0.7 units/hr, 24 hrs per day.   continue mealtime bolus of 3-5 units per meal. Subtract a few units if exercise is upcoming.   continue correction bolus (which some people call "sensitivity," or "insulin sensitivity ratio," or just "isr") of 1 unit for each by 30 which your glucose exceeds 120.  Please see Bonita Quin today, to get a continuous glucose monitor.

## 2015-10-19 ENCOUNTER — Telehealth: Payer: Self-pay | Admitting: Endocrinology

## 2015-10-19 NOTE — Telephone Encounter (Signed)
I contacted the pt and left a voicemail advising we have sent a PA for his test strips and we are waiting on the response.  Pt will be contacted once we have received a reply.

## 2015-10-19 NOTE — Telephone Encounter (Signed)
Patient called stating that he has switched his test strips and now his insurance will not approve   Rx: Contour Next   Pharmacy: [facility]    Thank you

## 2015-10-19 NOTE — Telephone Encounter (Signed)
Prior Authorization submitted for the Test Strips. Waiting on the response.

## 2015-10-21 ENCOUNTER — Telehealth: Payer: Self-pay | Admitting: Internal Medicine

## 2015-10-21 NOTE — Telephone Encounter (Signed)
Pt states Dr Everardo AllEllison suggest he see dr Fabian SharpPanosh for his Restless leg syndrome. However, pt not seen in over 2 yrs,so a 30 min visit will be needed. Pt states he was hoping for this Mon, Tues, or Wed. No 30 min until Nov 22 and he states that is too long. Please advise on where/when to schedule. Thanks

## 2015-10-22 NOTE — Telephone Encounter (Signed)
Pt is scheduled °

## 2015-10-22 NOTE — Telephone Encounter (Signed)
How about next Friday  Nov 18th in afternoon  Otherwise   Nov 16th wed at 11:30  Is ok

## 2015-10-25 ENCOUNTER — Encounter: Payer: 59 | Admitting: Nutrition

## 2015-10-25 DIAGNOSIS — E109 Type 1 diabetes mellitus without complications: Secondary | ICD-10-CM

## 2015-10-26 NOTE — Patient Instructions (Signed)
Test blood sugars before meals and at bedtime. Continue to change cartridge and infusion set every 3 days--rotation sites appropriately. Go to the Medtronic website and sign up for Care link.  Call us with login and password.   Call if blood sugars are dropping or remaining high.

## 2015-10-26 NOTE — Progress Notes (Signed)
Patient reports no high blood sugars in the last week.  All ac readings are less than 135, and 2hr. pc readings are all less than 170![name]  He has had one low blood sugar, that was treated appropriate, and due to exercise (jog for 30 min.).  Discussed the need to reduce the premeal bolus by 25 % when exercising for 20mn. He agreed to do this.   We reviewed all topics discussed last week, and he was given handouts on high blood sugar protocol, sick day guidelines, emergency kit to carry with you, low blood sugar treatments.  We reviewed each of the handouts, and he reported good understanding of this He reports no difficulty with changing his infusion sets and resorvours, as well as his sensors.  We also reviewed temp basal rates--when and how to  Do this, and duel wave bolusing--when and how to use this.  He reported good understanding and had no final questions.   He signed off the pump checklist as understanding all topics.  He reports that he likes his CGM, and has had no difficulty with this.  He is calibrating q 12 hours, and changing it out q 5 days.  He had no final questions. He was told to make an appt. With Dr. ELoanne Drillingin 2 weeks. He agreed to do this.

## 2015-10-27 ENCOUNTER — Telehealth: Payer: Self-pay | Admitting: Nutrition

## 2015-10-27 ENCOUNTER — Ambulatory Visit (INDEPENDENT_AMBULATORY_CARE_PROVIDER_SITE_OTHER): Payer: 59 | Admitting: Internal Medicine

## 2015-10-27 ENCOUNTER — Encounter: Payer: Self-pay | Admitting: Internal Medicine

## 2015-10-27 VITALS — BP 106/80 | Temp 98.2°F | Wt 202.8 lb

## 2015-10-27 DIAGNOSIS — E109 Type 1 diabetes mellitus without complications: Secondary | ICD-10-CM | POA: Diagnosis not present

## 2015-10-27 DIAGNOSIS — G2581 Restless legs syndrome: Secondary | ICD-10-CM | POA: Diagnosis not present

## 2015-10-27 DIAGNOSIS — E785 Hyperlipidemia, unspecified: Secondary | ICD-10-CM | POA: Diagnosis not present

## 2015-10-27 LAB — CBC WITH DIFFERENTIAL/PLATELET
BASOS ABS: 0.1 10*3/uL (ref 0.0–0.1)
Basophils Relative: 0.8 % (ref 0.0–3.0)
Eosinophils Absolute: 0.1 10*3/uL (ref 0.0–0.7)
Eosinophils Relative: 1.9 % (ref 0.0–5.0)
HEMATOCRIT: 49.1 % (ref 39.0–52.0)
HEMOGLOBIN: 16.3 g/dL (ref 13.0–17.0)
LYMPHS ABS: 2.6 10*3/uL (ref 0.7–4.0)
Lymphocytes Relative: 36.9 % (ref 12.0–46.0)
MCHC: 33.3 g/dL (ref 30.0–36.0)
MCV: 87.4 fl (ref 78.0–100.0)
MONO ABS: 0.7 10*3/uL (ref 0.1–1.0)
Monocytes Relative: 9.4 % (ref 3.0–12.0)
Neutro Abs: 3.6 10*3/uL (ref 1.4–7.7)
Neutrophils Relative %: 51 % (ref 43.0–77.0)
Platelets: 309 10*3/uL (ref 150.0–400.0)
RBC: 5.62 Mil/uL (ref 4.22–5.81)
RDW: 12.4 % (ref 11.5–15.5)
WBC: 7 10*3/uL (ref 4.0–10.5)

## 2015-10-27 LAB — BASIC METABOLIC PANEL
BUN: 15 mg/dL (ref 6–23)
CO2: 29 meq/L (ref 19–32)
Calcium: 10.1 mg/dL (ref 8.4–10.5)
Chloride: 102 mEq/L (ref 96–112)
Creatinine, Ser: 0.87 mg/dL (ref 0.40–1.50)
GFR: 112.95 mL/min (ref >60.00)
GLUCOSE: 124 mg/dL — AB (ref 70–99)
POTASSIUM: 4.2 meq/L (ref 3.5–5.1)
Sodium: 139 mEq/L (ref 135–145)

## 2015-10-27 LAB — IBC PANEL
IRON: 165 ug/dL (ref 42–165)
SATURATION RATIOS: 44.3 % (ref 20.0–50.0)
TRANSFERRIN: 266 mg/dL (ref 212.0–360.0)

## 2015-10-27 LAB — LIPID PANEL
Cholesterol: 191 mg/dL (ref 0–200)
HDL: 40.7 mg/dL (ref >39.00)
NONHDL: 150.04
Total CHOL/HDL Ratio: 5
Triglycerides: 210 mg/dL — ABNORMAL HIGH (ref 0.0–149.0)
VLDL: 42 mg/dL — AB (ref 0.0–40.0)

## 2015-10-27 LAB — LDL CHOLESTEROL, DIRECT: Direct LDL: 119 mg/dL

## 2015-10-27 LAB — T4, FREE: FREE T4: 0.69 ng/dL (ref 0.60–1.60)

## 2015-10-27 LAB — FERRITIN: FERRITIN: 117.3 ng/mL (ref 22.0–322.0)

## 2015-10-27 LAB — TSH: TSH: 1.67 u[IU]/mL (ref 0.35–4.50)

## 2015-10-27 NOTE — Telephone Encounter (Signed)
Patient reported no difficulty giving self boluses for lunch and supper today.  Blood sugar acL was 123, and 2hr. pcL 143, acS: 99.   Told him that if his blood sugar at HS is less than 90, he will need to take a snack of 15 grams of carbohydrate, with no bolus.  He agreed to do this, and had no questions.

## 2015-10-27 NOTE — Progress Notes (Signed)
[name] was instructed on the use of the Medtronic 630G insulin pump.  He had put in the date/time and had already synced the meter to the pump.   He put in the settings per Dr. George HughEllison's orders:  Basal rate: 0.7u/hr, I/C: 10, sensitivity: 30, target: 100,  We reviewed how to give a bolus and he re demonstrated this procedure with complete understanding.   He requested that he wanted to start the sensor as well today.  We discussed the difference between blood and insterstitial sugar level, and the need to always use a meter reading when bolusing.  He reported good understanding of this.  He inserted a sensor into his upper left abdominal quadrant, and taped it down appropriately.  We discussed how and, the need to calibrate the sensor q 12 hours,and when best to do this.  He reported good understanding of this and had no final questions.   He was given a log to record blood sugars and told to test ac, 2hr pc, HS and 3 AM.  He reported good understanding of this, and had no final questions.

## 2015-10-27 NOTE — Patient Instructions (Signed)
Test before, 2 hours after each meal, at bedtime and 3AM tonight. Call today if blood sugars drop low or remain high.   Review books 2 for pump and sensor use tonight

## 2015-10-27 NOTE — Progress Notes (Signed)
Pre visit review using our clinic review tool, if applicable. No additional management support is needed unless otherwise documented below in the visit note.  Chief Complaint  Patient presents with  . Night Leg Movement  . Daytime Leg Numbness and Tingling    HPI: Andrew Ruiz III 24 y.o. comesin last seen 2014 when he presented with type 1 DM sx and now under rx with endo  Doing well but concern about poss RLS . Has has some sx since younger age  In HS but not that problematic  However  "Restless  Leg"  when at night  When going down, hard  To get comfortable  F Since HS minimal but now bothering him.  When lays down  Turn off tv   Hard to get comfortable . And feels like need a jog. And exercise legs.  No pain  Some times  feet go numb   But  b12 was normal  And went to neurology for ue sx numbness  Had ncs  .  Check for  cts  Neuropathy  Sometimes .  Same building .  Gm says in family  Dm well controlled with pump and no sig lows or se  No sleep disorder hx   Or sx  ROS: See pertinent positives and negatives per HPI. No bleeding no rx  No new meds  Injury   Past Medical History  Diagnosis Date  . ADHD (attention deficit hyperactivity disorder)     [facility] no ld  . Allergy   . Ventricular pre-excitation     on ekg card eval  no restrictions  . History of precordial chest pain   . Pilonidal abscess   . Diabetes mellitus without complication (HCC)   . Pilonidal cyst with abscess 08/28/2011    Family History  Problem Relation Age of Onset  . ADD / ADHD Brother   . Restless legs syndrome      grandmother on sinemet?    Social History   Social History  . Marital Status: Single    Spouse Name: N/A  . Number of Children: N/A  . Years of Education: N/A   Social History Main Topics  . Smoking status: Never Smoker   . Smokeless tobacco: None  . Alcohol Use: No  . Drug Use: No  . Sexual Activity: Not Asked   Other Topics Concern  . None   Social  History Narrative   ** Merged History Encounter **       HH of 4   Pets Cats,dogs and bird      Was at [employer]. 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    Lives alone in an apartment    Outpatient Prescriptions Prior to Visit  Medication Sig Dispense Refill  . ACCU-CHEK FASTCLIX LANCETS MISC     . glucagon (GLUCAGON EMERGENCY) 1 MG injection Inject 1 mg into the vein once as needed. 1 each 12  . glucose blood (BAYER CONTOUR NEXT TEST) test strip 8 times per day, and lancets 8/day 240 each 12  . HUMALOG 100 UNIT/ML injection FOR USE IN PUMP, TOTAL OF 60 UNITS PER DAY. 20 mL 1  . ibuprofen (ADVIL,MOTRIN) 600 MG tablet Take 1 tablet (600 mg total) by mouth every 6 (six) hours as needed. 30 tablet 0  . Insulin Infusion Pump Supplies (INSULIN PUMP SYRINGE RESERVOIR) MISC  1 Device by Does not apply route every 3 (three) days. 30 each 3  . Insulin Infusion Pump Supplies (ULTRAFLEX) MISC 1 Device by Does not apply route every 3 (three) days. 80 cm tube length, 8 mm catheter length, ref # [id number] 10 each 1  . Melatonin 5 MG TABS Take by mouth. PRN Sleep 1-2 tabs    . triamcinolone cream (KENALOG) 0.1 % Apply 1 application topically 4 (four) times daily. As needed for itching 80 g 0   No facility-administered medications prior to visit.     EXAM:  BP 106/80 mmHg  Temp(Src) 98.2 F (36.8 C) (Oral)  Wt 202 lb 12.8 oz (91.989 kg)  Body mass index is 29.93 kg/(m^2).  GENERAL: vitals reviewed and listed above, alert, oriented, appears well hydrated and in no acute distress HEENT: atraumatic, conjunctiva  clear, no obvious abnormalities on inspection of external nose and ears  NECK: no obvious masses on inspection palpation  LUNGS: clear to auscultation bilaterally, no wheezes, rales or rhonchi, good air movement CV: HRRR, no clubbing cyanosis or  peripheral edema nl cap refill  MS: moves all extremities without  noticeable focal  Abnormality NEURO: oriented x 3 CN 3-12 appear intact. No focal muscle weakness or atrophy. DTRs symmetrical. Gait WNL.  Grossly non focal. No tremor or abnormal movement. Foot exam normal and intact to monofilament   nv seems nl  And no atrophy   PSYCH: pleasant and cooperative, no obvious depression or anxiety   ASSESSMENT AND PLAN:  Discussed the following assessment and plan:  Uncontrolled restless leg syndrome - prob heredity r/o metabolic cause  neuro exam nl risk benefit of meds briefly reviwed - Plan: CBC with Differential/Platelet, Basic metabolic panel, Lipid panel, TSH, T4, free, Ferritin, IBC panel  HLD (hyperlipidemia) - Plan: CBC with Differential/Platelet, Basic metabolic panel, Lipid panel, TSH, T4, free, Ferritin, IBC panel  Type 1 diabetes mellitus without complication (HCC) - Plan: CBC with Differential/Platelet, Basic metabolic panel, Lipid panel, TSH, T4, free, Ferritin, IBC panel If neg  Metabolic cause then considier  meds  Although they have hx of se and augmentations. No obv neuropathy or back radicular sx.  -Patient advised to return or notify health care team  if symptoms worsen ,persist or new concerns arise.  Patient Instructions    Will notify you  of labs when available. Then depending on results  Consider intervnetion . meds done cure but may help control the symptoms . Some times they have side effects also but shouldn't make diabetes worse.   Restless Legs Syndrome Restless legs syndrome is a condition that causes uncomfortable feelings or sensations in the legs, especially while sitting or lying down. The sensations usually cause an overwhelming urge to move the legs. The arms can also sometimes be affected. The condition can range from mild to severe. The symptoms often interfere with a person's ability to sleep. CAUSES The cause of this condition is not known. RISK FACTORS This condition is more likely to develop in:  People who  are older than age 25.  Pregnant women. In general, restless legs syndrome is more common in women than in men.  People who have a family history of the condition.  People who have certain medical conditions, such as iron deficiency, kidney disease, Parkinson disease, or nerve damage.  People who take certain medicines, such as medicines for high blood pressure, nausea, colds, allergies, depression, and some heart conditions. SYMPTOMS The main symptom of this condition is uncomfortable sensations in  the legs. These sensations may be:  Described as pulling, tingling, prickling, throbbing, crawling, or burning.  Worse while you are sitting or lying down.  Worse during periods of rest or inactivity.  Worse at night, often interfering with your sleep.  Accompanied by a very strong urge to move your legs.  Temporarily relieved by movement of your legs. The sensations usually affect both sides of the body. The arms can also be affected, but this is rare. People who have this condition often have tiredness during the day because of their lack of sleep at night. DIAGNOSIS This condition may be diagnosed based on your description of the symptoms. You may also have tests, including blood tests, to check for other conditions that may lead to your symptoms. In some cases, you may be asked to spend some time in a sleep lab so your sleeping can be monitored. TREATMENT Treatment for this condition is focused on managing the symptoms. Treatment may include:  Self-help and lifestyle changes.  Medicines. HOME CARE INSTRUCTIONS  Take medicines only as directed by your health care provider.  Try these methods to get temporary relief from the uncomfortable sensations:  Massage your legs.  Walk or stretch.  Take a cold or hot bath.  Practice good sleep habits. For example, go to bed and get up at the same time every day.  Exercise regularly.  Practice ways of relaxing, such as yoga or  meditation.  Avoid caffeine and alcohol.  Do not use any tobacco products, including cigarettes, chewing tobacco, or electronic cigarettes. If you need help quitting, ask your health care provider.  Keep all follow-up visits as directed by your health care provider. This is important. SEEK MEDICAL CARE IF: Your symptoms do not improve with treatment, or they get worse.   This information is not intended to replace advice given to you by your health care provider. Make sure you discuss any questions you have with your health care provider.   Document Released: 11/17/2002 Document Revised: 04/13/2015 Document Reviewed: 11/23/2014 Elsevier Interactive Patient Education 2016 [employer].       [place]. Rhylee Pucillo M.D.

## 2015-10-27 NOTE — Patient Instructions (Signed)
   Will notify you  of labs when available. Then depending on results  Consider intervnetion . meds done cure but may help control the symptoms . Some times they have side effects also but shouldn't make diabetes worse.   Restless Legs Syndrome Restless legs syndrome is a condition that causes uncomfortable feelings or sensations in the legs, especially while sitting or lying down. The sensations usually cause an overwhelming urge to move the legs. The arms can also sometimes be affected. The condition can range from mild to severe. The symptoms often interfere with a person's ability to sleep. CAUSES The cause of this condition is not known. RISK FACTORS This condition is more likely to develop in:  People who are older than age 550.  Pregnant women. In general, restless legs syndrome is more common in women than in men.  People who have a family history of the condition.  People who have certain medical conditions, such as iron deficiency, kidney disease, Parkinson disease, or nerve damage.  People who take certain medicines, such as medicines for high blood pressure, nausea, colds, allergies, depression, and some heart conditions. SYMPTOMS The main symptom of this condition is uncomfortable sensations in the legs. These sensations may be:  Described as pulling, tingling, prickling, throbbing, crawling, or burning.  Worse while you are sitting or lying down.  Worse during periods of rest or inactivity.  Worse at night, often interfering with your sleep.  Accompanied by a very strong urge to move your legs.  Temporarily relieved by movement of your legs. The sensations usually affect both sides of the body. The arms can also be affected, but this is rare. People who have this condition often have tiredness during the day because of their lack of sleep at night. DIAGNOSIS This condition may be diagnosed based on your description of the symptoms. You may also have tests,  including blood tests, to check for other conditions that may lead to your symptoms. In some cases, you may be asked to spend some time in a sleep lab so your sleeping can be monitored. TREATMENT Treatment for this condition is focused on managing the symptoms. Treatment may include:  Self-help and lifestyle changes.  Medicines. HOME CARE INSTRUCTIONS  Take medicines only as directed by your health care provider.  Try these methods to get temporary relief from the uncomfortable sensations:  Massage your legs.  Walk or stretch.  Take a cold or hot bath.  Practice good sleep habits. For example, go to bed and get up at the same time every day.  Exercise regularly.  Practice ways of relaxing, such as yoga or meditation.  Avoid caffeine and alcohol.  Do not use any tobacco products, including cigarettes, chewing tobacco, or electronic cigarettes. If you need help quitting, ask your health care provider.  Keep all follow-up visits as directed by your health care provider. This is important. SEEK MEDICAL CARE IF: Your symptoms do not improve with treatment, or they get worse.   This information is not intended to replace advice given to you by your health care provider. Make sure you discuss any questions you have with your health care provider.   Document Released: 11/17/2002 Document Revised: 04/13/2015 Document Reviewed: 11/23/2014 Elsevier Interactive Patient Education [employer]2016 Elsevier Inc.

## 2015-10-30 ENCOUNTER — Encounter: Payer: Self-pay | Admitting: Internal Medicine

## 2015-11-02 ENCOUNTER — Other Ambulatory Visit: Payer: Self-pay | Admitting: Family Medicine

## 2015-11-02 ENCOUNTER — Telehealth: Payer: Self-pay | Admitting: Internal Medicine

## 2015-11-02 MED ORDER — GABAPENTIN 100 MG PO CAPS: ORAL_CAPSULE | ORAL | Status: DC

## 2015-11-02 NOTE — Telephone Encounter (Signed)
Pt notified of results.  See result note. 

## 2015-11-02 NOTE — Telephone Encounter (Signed)
Pt would like blood work results. Pt was seen on 10-27-15

## 2016-01-20 ENCOUNTER — Ambulatory Visit: Payer: Self-pay | Admitting: Endocrinology

## 2016-01-20 ENCOUNTER — Telehealth: Payer: Self-pay | Admitting: Endocrinology

## 2016-01-20 NOTE — Telephone Encounter (Signed)
Patient no showed today's appt. Please advise on how to follow up. °A. No follow up necessary. °B. Follow up urgent. Contact patient immediately. °C. Follow up necessary. Contact patient and schedule visit in ___ days. °D. Follow up advised. Contact patient and schedule visit in ____weeks. ° °

## 2016-01-21 NOTE — Telephone Encounter (Signed)
Andrew Ruiz, Could you contact this pt and reschedule? Thanks!

## 2016-01-21 NOTE — Telephone Encounter (Signed)
Please come back for a follow-up appointment in 2 weeks 

## 2016-01-24 NOTE — Telephone Encounter (Signed)
Lm for patient to call back.pb °

## 2016-01-26 ENCOUNTER — Telehealth: Payer: Self-pay | Admitting: Endocrinology

## 2016-01-26 MED ORDER — GLUCOSE BLOOD VI STRP: ORAL_STRIP | Status: DC

## 2016-01-26 NOTE — Telephone Encounter (Signed)
Rx submitted per pt's request.  

## 2016-01-26 NOTE — Telephone Encounter (Signed)
Pt needs a one time refill of test strips (he believes it is Contour) called into [facility] on [address].

## 2016-03-01 ENCOUNTER — Ambulatory Visit (INDEPENDENT_AMBULATORY_CARE_PROVIDER_SITE_OTHER): Payer: 59 | Admitting: Endocrinology

## 2016-03-01 ENCOUNTER — Telehealth: Payer: Self-pay | Admitting: Endocrinology

## 2016-03-01 ENCOUNTER — Encounter: Payer: Self-pay | Admitting: Endocrinology

## 2016-03-01 VITALS — BP 118/70 | HR 70 | Temp 98.5°F | Ht 69.0 in | Wt 196.0 lb

## 2016-03-01 DIAGNOSIS — E109 Type 1 diabetes mellitus without complications: Secondary | ICD-10-CM | POA: Diagnosis not present

## 2016-03-01 LAB — POCT GLYCOSYLATED HEMOGLOBIN (HGB A1C): HEMOGLOBIN A1C: 6.7

## 2016-03-01 MED ORDER — TRIAMCINOLONE ACETONIDE 0.1 % EX CREA: 1.0000 "application " | TOPICAL_CREAM | Freq: Three times a day (TID) | CUTANEOUS | Status: DC

## 2016-03-01 MED ORDER — GLUCOSE BLOOD VI STRP: ORAL_STRIP | Status: DC

## 2016-03-01 MED ORDER — INSULIN LISPRO 100 UNIT/ML ~~LOC~~ SOLN: SUBCUTANEOUS | Status: DC

## 2016-03-01 NOTE — Progress Notes (Signed)
Subjective:    Patient ID: Andrew Ruiz, male    DOB: 06-08-1991, 25 y.o.   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  He has an enlite continuous glucose monitor.   Interval history:  He takes these pump settings:  basal rate of 0.7 units/hr, 24 hrs per day. mealtime bolus of 4-6 units, depending on the size of the meal (he prefers to take a general estimate of his mealtime bolus, rather than CHO counting) correction bolus of 1 unit for each 30 by which cbg exceeds 100.   He takes a total of approx 40 units per day.  pt states he feels well in general.  Meter is downloaded today, and the printout is scanned into the record.  It varies from 90-330, but most are in the low-100's.   Past Medical History  Diagnosis Date  . ADHD (attention deficit hyperactivity disorder)     eval at [facility] no ld  . Allergy   . Ventricular pre-excitation     on ekg card eval  no restrictions  . History of precordial chest pain   . Pilonidal abscess   . Diabetes mellitus without complication (HCC)   . Pilonidal cyst with abscess 08/28/2011    No past surgical history on file.  Social History   Social History  . Marital Status: Single    Spouse Name: N/A  . Number of Children: N/A  . Years of Education: N/A   Occupational History  . Not on file.   Social History Main Topics  . Smoking status: Never Smoker   . Smokeless tobacco: Not on file  . Alcohol Use: No  . Drug Use: No  . Sexual Activity: Not on file   Other Topics Concern  . Not on file   Social History Narrative   ** Merged History Encounter **       HH of 4   Pets Cats,dogs and bird      Was at [employer]  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    Lives alone in an apartment    Current Outpatient Prescriptions on File Prior to Visit  Medication Sig Dispense Refill  . ACCU-CHEK FASTCLIX LANCETS MISC     . glucagon (GLUCAGON EMERGENCY) 1 MG injection Inject 1 mg into the vein once as needed. 1 each 12  . ibuprofen (ADVIL,MOTRIN) 600 MG tablet Take 1 tablet (600 mg total) by mouth every 6 (six) hours as needed. 30 tablet 0  . Insulin Infusion Pump Supplies (INSULIN PUMP SYRINGE RESERVOIR) MISC 1 Device by Does not apply route every 3 (three) days. 30 each 3  . Insulin Infusion Pump Supplies (ULTRAFLEX) MISC 1 Device by Does not apply route every 3 (three) days. 80 cm tube length, 8 mm catheter length, ref # [id number] 10 each 1  . Melatonin 5 MG TABS Take by mouth. PRN Sleep 1-2 tabs    . gabapentin (NEURONTIN) 100 MG capsule Take 1-3 tablets at night before bed. (Patient not taking: Reported on 03/01/2016) 60 capsule 0   No current facility-administered  medications on file prior to visit.    No Known Allergies  Family History  Problem Relation Age of Onset  . ADD / ADHD Brother   . Restless legs syndrome      grandmother on sinemet?    BP 118/70 mmHg  Pulse 70  Temp(Src) 98.5 F (36.9 C) (Oral)  Ht  (1.753 m)  Wt 196 lb (88.905 kg)  BMI 28.93 kg/m2  SpO2 95%   Review of Systems He denies hypoglycemia.  He has intermittent heat rash on the legs    Objective:   Physical Exam VITAL SIGNS:  See vs page GENERAL: no distress Pulses: dorsalis pedis intact bilat.   MSK: no deformity of the feet CV: no leg edema Skin:  no ulcer on the feet.  normal color and temp on the feet.  No rash is seen.   Neuro: sensation is intact to touch on the feet   A1c=6.7%    Assessment & Plan:  DM: well-controlled Rash, new, uncertain etiology  Patient is advised the following: Patient Instructions  Please come  back for a follow-up appointment in 3 months.   check your blood sugar 8 times a day--before the 3 meals, and at bedtime.  also check if you have symptoms of your blood sugar being too high or too low.  please keep a record of the readings and bring it to your next appointment here.  please call us sooner if your blood sugar goes below 70, or if you have a lot of readings over 200.  continue basal rate of 0.7 units/hr, 24 hrs per day.  continue mealtime bolus of 4-6 units per meal. Subtract a few units if exercise is upcoming.  i have sent a prescription to your pharmacy, for the heat rash.  continue correction bolus (which some people call "sensitivity," or "insulin sensitivity ratio," or just "isr") of 1 unit for each by 30 which your glucose exceeds 120.    i am happy to check the rash when you have it.

## 2016-03-01 NOTE — Patient Instructions (Addendum)
Please come back for a follow-up appointment in 3 months.   check your blood sugar 8 times a day--before the 3 meals, and at bedtime.  also check if you have symptoms of your blood sugar being too high or too low.  please keep a record of the readings and bring it to your next appointment here.  please call us sooner if your blood sugar goes below 70, or if you have a lot of readings over 200.  continue basal rate of 0.7 units/hr, 24 hrs per day.  continue mealtime bolus of 4-6 units per meal. Subtract a few units if exercise is upcoming.  i have sent a prescription to your pharmacy, for the heat rash.  continue correction bolus (which some people call "sensitivity," or "insulin sensitivity ratio," or just "isr") of 1 unit for each by 30 which your glucose exceeds 120.    i am happy to check the rash when you have it.

## 2016-03-01 NOTE — Telephone Encounter (Signed)
Pt called and said the pharmacy needs new prescriptions for his insulin and test strips

## 2016-03-01 NOTE — Telephone Encounter (Signed)
Rx submitted

## 2016-04-10 ENCOUNTER — Ambulatory Visit: Payer: Self-pay | Admitting: Internal Medicine

## 2016-04-10 NOTE — Progress Notes (Signed)
Pre visit review using our clinic review tool, if applicable. No additional management support is needed unless otherwise documented below in the visit note.  Chief Complaint  Patient presents with  . Rt Knee Pain    HPI: Andrew Ruiz 25 y.o. comesin for acute problem  And letter  For outside patrol vest. 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   Feels like inside .  Giving out. No no locking ? unstabe  But isnt that bad if runs but not running much recnetly No swelling . No limping mostly . Except favor when bad  Runs about once every other week  1.5 miles.  Now then bothring him at that time .   ROS: See pertinent positives and negatives per HPI. No systemic sx  patellar crepitus  Has hx of scoliosis  Past Medical History  Diagnosis Date  . ADHD (attention deficit hyperactivity disorder)     eval at [facility] no ld  . Allergy   . Ventricular pre-excitation     on ekg card eval  no restrictions  . History of precordial chest pain   . Pilonidal abscess   . Diabetes mellitus without complication (HCC)   . Pilonidal cyst with abscess 08/28/2011    Family History  Problem Relation Age of Onset  . ADD / ADHD Brother   . Restless legs syndrome      grandmother on sinemet?    Social History   Social History  . Marital Status: Single    Spouse Name: N/A  . Number of Children: N/A  . Years of Education: N/A   Social History Main Topics  . Smoking status: Never Smoker   . Smokeless tobacco: None  . Alcohol Use: No  . Drug Use: No  . Sexual Activity: Not Asked   Other Topics Concern  . None   Social History Narrative   ** Merged History Encounter **       HH of 4   Pets Cats,dogs and birdWas at [employer]. 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    Lives  alone in an apartment    Outpatient Prescriptions Prior to Visit  Medication Sig Dispense Refill  . ACCU-CHEK FASTCLIX LANCETS MISC     . glucagon (GLUCAGON EMERGENCY) 1 MG injection Inject 1 mg into the vein once as needed. 1 each 12  . glucose blood (BAYER CONTOUR NEXT TEST) test strip 8 times per day, and lancets 8/day 240 each 12  . ibuprofen (ADVIL,MOTRIN) 600 MG tablet Take 1 tablet (600 mg total) by mouth every 6 (six) hours as needed. 30 tablet 0  . Insulin Infusion Pump Supplies (INSULIN PUMP SYRINGE RESERVOIR) MISC 1 Device by Does not apply route every 3 (three) days. 30 each 3  . Insulin Infusion Pump Supplies (ULTRAFLEX) MISC 1 Device by Does not apply route every 3 (three) days. 80 cm tube length, 8 mm catheter length, ref # [id number] 10 each 1  . insulin lispro (HUMALOG) 100 UNIT/ML injection FOR USE IN PUMP, TOTAL OF 60 UNITS PER DAY. 20 mL 1  . Melatonin 5 MG TABS Take by mouth. PRN Sleep 1-2 tabs    . gabapentin (NEURONTIN) 100  MG capsule Take 1-3 tablets at night before bed. (Patient not taking: Reported on 03/01/2016) 60 capsule 0  . triamcinolone cream (KENALOG) 0.1 % Apply 1 application topically 3 (three) times daily. As needed for rash (Patient not taking: Reported on 04/11/2016) 45 g 2   No facility-administered medications prior to visit.     EXAM:  BP 110/76 mmHg  Temp(Src) 98.2 F (36.8 C) (Oral)  Wt 192 lb (87.091 kg)  Body mass index is 28.34 kg/(m^2).  GENERAL: vitals reviewed and listed above, alert, oriented, appears well hydrated and in no acute distress HEENT: atraumatic, conjunctiva  clear, no obvious abnormalities on inspection of external nose and ears NECK: no obvious masses on inspection palpation  MS: moves all extremities without noticeable focal  Abnormality righ knee  Neg jt line tenderness  No creptius or instability obfious  But some asymmetry . =?drawer  PSYCH: pleasant and cooperative, no obvious depression or anxiety  ASSESSMENT AND  PLAN:  Discussed the following assessment and plan:  Right anterior knee pain  Idiopathic scoliosis - mild   currently not  a problem but vest  agree for prevention problems  Uncertain cause of right k predicament   .  mostly anterior pain issue  Getting in and out of car at times ? If patellar femoral issue. Atypical presentation   Refer to sm and exercise given   Will do letter to get external vest   .  Letter for travel to get from dr Everardo AllEllison.  -Patient advised to return or notify health care team  if symptoms worsen ,persist or new concerns arise.  Patient Instructions  Uncertain why your  Knee is bothring you Now .  May need an exercises   And ice .  Will do  SM referral for this .   Generic Knee Exercises EXERCISES RANGE OF MOTION (ROM) AND STRETCHING EXERCISES These exercises may help you when beginning to rehabilitate your injury. Your symptoms may resolve with or without further involvement from your physician, physical therapist, or athletic trainer. While completing these exercises, remember:   Restoring tissue flexibility helps normal motion to return to the joints. This allows healthier, less painful movement and activity.  An effective stretch should be held for at least 30 seconds.  A stretch should never be painful. You should only feel a gentle lengthening or release in the stretched tissue. STRETCH - Knee Extension, Prone  Lie on your stomach on a firm surface, such as a bed or countertop. Place your right / left knee and leg just beyond the edge of the surface. You may wish to place a towel under the far end of your right / left thigh for comfort.  Relax your leg muscles and allow gravity to straighten your knee. Your clinician may advise you to add an ankle weight if more resistance is helpful for you.  You should feel a stretch in the back of your right / left knee. Hold this position for __________ seconds. Repeat __________ times. Complete this stretch  __________ times per day. * Your physician, physical therapist, or athletic trainer may ask you to add ankle weight to enhance your stretch.  RANGE OF MOTION - Knee Flexion, Active  Lie on your back with both knees straight. (If this causes back discomfort, bend your opposite knee, placing your foot flat on the floor.)  Slowly slide your heel back toward your buttocks until you feel a gentle stretch in the front of your knee or thigh.  Hold  for __________ seconds. Slowly slide your heel back to the starting position. Repeat __________ times. Complete this exercise __________ times per day.  STRETCH - Quadriceps, Prone   Lie on your stomach on a firm surface, such as a bed or padded floor.  Bend your right / left knee and grasp your ankle. If you are unable to reach your ankle or pant leg, use a belt around your foot to lengthen your reach.  Gently pull your heel toward your buttocks. Your knee should not slide out to the side. You should feel a stretch in the front of your thigh and/or knee.  Hold this position for __________ seconds. Repeat __________ times. Complete this stretch __________ times per day.  STRETCH - Hamstrings, Supine   Lie on your back. Loop a belt or towel over the ball of your right / left foot.  Straighten your right / left knee and slowly pull on the belt to raise your leg. Do not allow the right / left knee to bend. Keep your opposite leg flat on the floor.  Raise the leg until you feel a gentle stretch behind your right / left knee or thigh. Hold this position for __________ seconds. Repeat __________ times. Complete this stretch __________ times per day.  STRENGTHENING EXERCISES These exercises may help you when beginning to rehabilitate your injury. They may resolve your symptoms with or without further involvement from your physician, physical therapist, or athletic trainer. While completing these exercises, remember:   Muscles can gain both the endurance  and the strength needed for everyday activities through controlled exercises.  Complete these exercises as instructed by your physician, physical therapist, or athletic trainer. Progress the resistance and repetitions only as guided.  You may experience muscle soreness or fatigue, but the pain or discomfort you are trying to eliminate should never worsen during these exercises. If this pain does worsen, stop and make certain you are following the directions exactly. If the pain is still present after adjustments, discontinue the exercise until you can discuss the trouble with your clinician. STRENGTH - Quadriceps, Isometrics  Lie on your back with your right / left leg extended and your opposite knee bent.  Gradually tense the muscles in the front of your right / left thigh. You should see either your knee cap slide up toward your hip or increased dimpling just above the knee. This motion will push the back of the knee down toward the floor/mat/bed on which you are lying.  Hold the muscle as tight as you can without increasing your pain for __________ seconds.  Relax the muscles slowly and completely in between each repetition. Repeat __________ times. Complete this exercise __________ times per day.  STRENGTH - Quadriceps, Short Arcs   Lie on your back. Place a __________ inch towel roll under your knee so that the knee slightly bends.  Raise only your lower leg by tightening the muscles in the front of your thigh. Do not allow your thigh to rise.  Hold this position for __________ seconds. Repeat __________ times. Complete this exercise __________ times per day.  OPTIONAL ANKLE WEIGHTS: Begin with ____________________, but DO NOT exceed ____________________. Increase in 1 pound/0.5 kilogram increments.  STRENGTH - Quadriceps, Straight Leg Raises  Quality counts! Watch for signs that the quadriceps muscle is working to insure you are strengthening the correct muscles and not "cheating" by  substituting with healthier muscles.  Lay on your back with your right / left leg extended and your opposite knee  bent.  Tense the muscles in the front of your right / left thigh. You should see either your knee cap slide up or increased dimpling just above the knee. Your thigh may even quiver.  Tighten these muscles even more and raise your leg 4 to 6 inches off the floor. Hold for __________ seconds.  Keeping these muscles tense, lower your leg.  Relax the muscles slowly and completely in between each repetition. Repeat __________ times. Complete this exercise __________ times per day.  STRENGTH - Hamstring, Curls  Lay on your stomach with your legs extended. (If you lay on a bed, your feet may hang over the edge.)  Tighten the muscles in the back of your thigh to bend your right / left knee up to 90 degrees. Keep your hips flat on the bed/floor.  Hold this position for __________ seconds.  Slowly lower your leg back to the starting position. Repeat __________ times. Complete this exercise __________ times per day.  OPTIONAL ANKLE WEIGHTS: Begin with ____________________, but DO NOT exceed ____________________. Increase in 1 pound/0.5 kilogram increments.  STRENGTH - Quadriceps, Squats  Stand in a door frame so that your feet and knees are in line with the frame.  Use your hands for balance, not support, on the frame.  Slowly lower your weight, bending at the hips and knees. Keep your lower legs upright so that they are parallel with the door frame. Squat only within the range that does not increase your knee pain. Never let your hips drop below your knees.  Slowly return upright, pushing with your legs, not pulling with your hands. Repeat __________ times. Complete this exercise __________ times per day.  STRENGTH - Quadriceps, Wall Slides  Follow guidelines for form closely. Increased knee pain often results from poorly placed feet or knees.  Lean against a smooth wall or door  and walk your feet out 18-24 inches. Place your feet hip-width apart.  Slowly slide down the wall or door until your knees bend __________ degrees.* Keep your knees over your heels, not your toes, and in line with your hips, not falling to either side.  Hold for __________ seconds. Stand up to rest for __________ seconds in between each repetition. Repeat __________ times. Complete this exercise __________ times per day. * Your physician, physical therapist, or athletic trainer will alter this angle based on your symptoms and progress.   This information is not intended to replace advice given to you by your health care provider. Make sure you discuss any questions you have with your health care provider.   Document Released: 10/11/2005 Document Revised: 12/18/2014 Document Reviewed: 03/11/2009 Elsevier Interactive Patient Education 2016 [employer].      [place]. Gerado Nabers M.D.

## 2016-04-11 ENCOUNTER — Encounter: Payer: Self-pay | Admitting: Internal Medicine

## 2016-04-11 ENCOUNTER — Telehealth: Payer: Self-pay | Admitting: Endocrinology

## 2016-04-11 ENCOUNTER — Ambulatory Visit (INDEPENDENT_AMBULATORY_CARE_PROVIDER_SITE_OTHER): Payer: 59 | Admitting: Internal Medicine

## 2016-04-11 VITALS — BP 110/76 | Temp 98.2°F | Wt 192.0 lb

## 2016-04-11 DIAGNOSIS — M25561 Pain in right knee: Secondary | ICD-10-CM

## 2016-04-11 DIAGNOSIS — M4120 Other idiopathic scoliosis, site unspecified: Secondary | ICD-10-CM | POA: Diagnosis not present

## 2016-04-11 NOTE — Telephone Encounter (Signed)
Patient need a doctors note for travel for all diabetic supplys

## 2016-04-11 NOTE — Telephone Encounter (Signed)
i would be happy to do, but there is a charge All you need are the prescription labels.

## 2016-04-11 NOTE — Telephone Encounter (Signed)
See note below   Thanks

## 2016-04-11 NOTE — Patient Instructions (Addendum)
Uncertain why your  Knee is bothring you Now .  May need an exercises   And ice .  Will do  SM referral for this .   Generic Knee Exercises EXERCISES RANGE OF MOTION (ROM) AND STRETCHING EXERCISES These exercises may help you when beginning to rehabilitate your injury. Your symptoms may resolve with or without further involvement from your physician, physical therapist, or athletic trainer. While completing these exercises, remember:   Restoring tissue flexibility helps normal motion to return to the joints. This allows healthier, less painful movement and activity.  An effective stretch should be held for at least 30 seconds.  A stretch should never be painful. You should only feel a gentle lengthening or release in the stretched tissue. STRETCH - Knee Extension, Prone  Lie on your stomach on a firm surface, such as a bed or countertop. Place your right / left knee and leg just beyond the edge of the surface. You may wish to place a towel under the far end of your right / left thigh for comfort.  Relax your leg muscles and allow gravity to straighten your knee. Your clinician may advise you to add an ankle weight if more resistance is helpful for you.  You should feel a stretch in the back of your right / left knee. Hold this position for __________ seconds. Repeat __________ times. Complete this stretch __________ times per day. * Your physician, physical therapist, or athletic trainer may ask you to add ankle weight to enhance your stretch.  RANGE OF MOTION - Knee Flexion, Active  Lie on your back with both knees straight. (If this causes back discomfort, bend your opposite knee, placing your foot flat on the floor.)  Slowly slide your heel back toward your buttocks until you feel a gentle stretch in the front of your knee or thigh.  Hold for __________ seconds. Slowly slide your heel back to the starting position. Repeat __________ times. Complete this exercise __________ times  per day.  STRETCH - Quadriceps, Prone   Lie on your stomach on a firm surface, such as a bed or padded floor.  Bend your right / left knee and grasp your ankle. If you are unable to reach your ankle or pant leg, use a belt around your foot to lengthen your reach.  Gently pull your heel toward your buttocks. Your knee should not slide out to the side. You should feel a stretch in the front of your thigh and/or knee.  Hold this position for __________ seconds. Repeat __________ times. Complete this stretch __________ times per day.  STRETCH - Hamstrings, Supine   Lie on your back. Loop a belt or towel over the ball of your right / left foot.  Straighten your right / left knee and slowly pull on the belt to raise your leg. Do not allow the right / left knee to bend. Keep your opposite leg flat on the floor.  Raise the leg until you feel a gentle stretch behind your right / left knee or thigh. Hold this position for __________ seconds. Repeat __________ times. Complete this stretch __________ times per day.  STRENGTHENING EXERCISES These exercises may help you when beginning to rehabilitate your injury. They may resolve your symptoms with or without further involvement from your physician, physical therapist, or athletic trainer. While completing these exercises, remember:   Muscles can gain both the endurance and the strength needed for everyday activities through controlled exercises.  Complete these exercises as instructed by your  physician, physical therapist, or athletic trainer. Progress the resistance and repetitions only as guided.  You may experience muscle soreness or fatigue, but the pain or discomfort you are trying to eliminate should never worsen during these exercises. If this pain does worsen, stop and make certain you are following the directions exactly. If the pain is still present after adjustments, discontinue the exercise until you can discuss the trouble with your  clinician. STRENGTH - Quadriceps, Isometrics  Lie on your back with your right / left leg extended and your opposite knee bent.  Gradually tense the muscles in the front of your right / left thigh. You should see either your knee cap slide up toward your hip or increased dimpling just above the knee. This motion will push the back of the knee down toward the floor/mat/bed on which you are lying.  Hold the muscle as tight as you can without increasing your pain for __________ seconds.  Relax the muscles slowly and completely in between each repetition. Repeat __________ times. Complete this exercise __________ times per day.  STRENGTH - Quadriceps, Short Arcs   Lie on your back. Place a __________ inch towel roll under your knee so that the knee slightly bends.  Raise only your lower leg by tightening the muscles in the front of your thigh. Do not allow your thigh to rise.  Hold this position for __________ seconds. Repeat __________ times. Complete this exercise __________ times per day.  OPTIONAL ANKLE WEIGHTS: Begin with ____________________, but DO NOT exceed ____________________. Increase in 1 pound/0.5 kilogram increments.  STRENGTH - Quadriceps, Straight Leg Raises  Quality counts! Watch for signs that the quadriceps muscle is working to insure you are strengthening the correct muscles and not "cheating" by substituting with healthier muscles.  Lay on your back with your right / left leg extended and your opposite knee bent.  Tense the muscles in the front of your right / left thigh. You should see either your knee cap slide up or increased dimpling just above the knee. Your thigh may even quiver.  Tighten these muscles even more and raise your leg 4 to 6 inches off the floor. Hold for __________ seconds.  Keeping these muscles tense, lower your leg.  Relax the muscles slowly and completely in between each repetition. Repeat __________ times. Complete this exercise __________  times per day.  STRENGTH - Hamstring, Curls  Lay on your stomach with your legs extended. (If you lay on a bed, your feet may hang over the edge.)  Tighten the muscles in the back of your thigh to bend your right / left knee up to 90 degrees. Keep your hips flat on the bed/floor.  Hold this position for __________ seconds.  Slowly lower your leg back to the starting position. Repeat __________ times. Complete this exercise __________ times per day.  OPTIONAL ANKLE WEIGHTS: Begin with ____________________, but DO NOT exceed ____________________. Increase in 1 pound/0.5 kilogram increments.  STRENGTH - Quadriceps, Squats  Stand in a door frame so that your feet and knees are in line with the frame.  Use your hands for balance, not support, on the frame.  Slowly lower your weight, bending at the hips and knees. Keep your lower legs upright so that they are parallel with the door frame. Squat only within the range that does not increase your knee pain. Never let your hips drop below your knees.  Slowly return upright, pushing with your legs, not pulling with your hands. Repeat __________ times.  Complete this exercise __________ times per day.  STRENGTH - Quadriceps, Wall Slides  Follow guidelines for form closely. Increased knee pain often results from poorly placed feet or knees.  Lean against a smooth wall or door and walk your feet out 18-24 inches. Place your feet hip-width apart.  Slowly slide down the wall or door until your knees bend __________ degrees.* Keep your knees over your heels, not your toes, and in line with your hips, not falling to either side.  Hold for __________ seconds. Stand up to rest for __________ seconds in between each repetition. Repeat __________ times. Complete this exercise __________ times per day. * Your physician, physical therapist, or athletic trainer will alter this angle based on your symptoms and progress.   This information is not intended to  replace advice given to you by your health care provider. Make sure you discuss any questions you have with your health care provider.   Document Released: 10/11/2005 Document Revised: 12/18/2014 Document Reviewed: 03/11/2009 Elsevier Interactive Patient Education [employer]

## 2016-04-12 NOTE — Telephone Encounter (Signed)
i printed 

## 2016-04-12 NOTE — Telephone Encounter (Signed)
I contacted the pt and advised letter is ready for pick up. Letter placed up front.

## 2016-04-12 NOTE — Telephone Encounter (Signed)
I contacted the pt and advised of note below. Pt stated some of the prescriptions he has does not have the labels on it anymore. Pt wanted to know if we could still do the letter just so he is covered.  Thanks!

## 2016-04-24 ENCOUNTER — Other Ambulatory Visit: Payer: Self-pay | Admitting: Endocrinology

## 2016-04-26 ENCOUNTER — Ambulatory Visit (INDEPENDENT_AMBULATORY_CARE_PROVIDER_SITE_OTHER): Payer: 59 | Admitting: Family Medicine

## 2016-04-26 ENCOUNTER — Encounter: Payer: Self-pay | Admitting: Family Medicine

## 2016-04-26 VITALS — BP 120/76 | HR 71 | Ht 68.0 in | Wt 195.0 lb

## 2016-04-26 DIAGNOSIS — M222X1 Patellofemoral disorders, right knee: Secondary | ICD-10-CM | POA: Diagnosis not present

## 2016-04-26 MED ORDER — DICLOFENAC SODIUM 2 % TD SOLN: 2.0000 "application " | Freq: Two times a day (BID) | TRANSDERMAL | Status: AC

## 2016-04-26 NOTE — Progress Notes (Signed)
Pre visit review using our clinic review tool, if applicable. No additional management support is needed unless otherwise documented below in the visit note. 

## 2016-04-26 NOTE — Patient Instructions (Addendum)
Good to see you.  Ice 20 minutes 2 times daily. Usually after activity and before bed. Exercises 3 times a week.  pennsaid pinkie amount topically 2 times daily as needed.  Vitamin D 2000 IU daily  Avoid running but biking would be great  Use a pillow or something soft with kneeling.  See me again in 3 weeks to make sure you are doing well.

## 2016-04-26 NOTE — Progress Notes (Signed)
Andrew ScaleZach Kylene Ruiz D.O. [facility] [address] [place], [place] [address] Phone: [phone] Subjective:    I'm seeing this patient by the request  of:  Andrew HarpPANOSH,Andrew KOTVAN, MD   CC: right knee.  BJY:[id number]:Subjective Andrew Ruiz is a 25 y.o. male coming in with complaint of right knee pain. Patient states that his been going on for approximately 1 month. Does not rib number any true injury. Discuss the pain as a dull, throbbing aching sensation. Sometimes associated with swelling. Sometimes hurts more when putting pressure on the knee. Rates the severity pain is 6 out of 10. Not running. Was not doing any exercises before this started to occur. Has not tried any over-the-counter medications.     Past Medical History  Diagnosis Date  . ADHD (attention deficit hyperactivity disorder)     [facility]  . Allergy   . Ventricular pre-excitation     on ekg card eval  no restrictions  . History of precordial chest pain   . Pilonidal abscess   . Diabetes mellitus without complication (HCC)   . Pilonidal cyst with abscess 08/28/2011   No past surgical history on file. Social History   Social History  . Marital Status: Single    Spouse Name: N/A  . Number of Children: N/A  . Years of Education: N/A   Social History Main Topics  . Smoking status: Never Smoker   . Smokeless tobacco: None  . Alcohol Use: No  . Drug Use: No  . Sexual Activity: Not Asked   Other Topics Concern  . None   Social History Narrative   ** Merged History Encounter **       HH of 4   Pets Cats,dogs and birdWas at [employer] 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    Lives alone in an apartment   No Known Allergies Family History  Problem Relation Age of Onset  . ADD / ADHD Brother   . Restless legs syndrome      grandmother on sinemet?    Past medical history, social, surgical and family history all reviewed in  electronic medical record.  No pertanent information unless stated regarding to the chief complaint.   Review of Systems: No headache, visual changes, nausea, vomiting, diarrhea, constipation, dizziness, abdominal pain, skin rash, fevers, chills, night sweats, weight loss, swollen lymph nodes, body aches, joint swelling, muscle aches, chest pain, shortness of breath, mood changes.   Objective Blood pressure 120/76, pulse 71, height 5\' 8"  (1.727 m), weight 195 lb (88.451 kg), SpO2 97 %.  General: No apparent distress alert and oriented x3 mood and affect normal, dressed appropriately.  HEENT: Pupils equal, extraocular movements intact  Respiratory: Patient's speak in full sentences and does not appear short of breath  Cardiovascular: No lower extremity edema, non tender, no erythema  Skin: Warm dry intact with no signs of infection or rash on extremities or on axial skeleton.  Abdomen: Soft nontender  Neuro: Cranial nerves II through XII are intact, neurovascularly intact in all extremities with 2+ DTRs and 2+ pulses.  Lymph: No lymphadenopathy of posterior or anterior cervical chain or axillae bilaterally.  Gait normal with good balance and coordination.  MSK:  Non tender with full range of motion and good stability and symmetric strength and tone of shoulders, elbows, wrist, hip, and ankles bilaterally.  Knee:right  Normal to inspection with no erythema or effusion or obvious bony abnormalities. Mild lateal tilt Palpation normal with no warmth, joint line tenderness, patellar tenderness, or condyle tenderness. ROM full in flexion and extension and lower leg rotation. Ligaments with solid consistent endpoints including ACL, PCL, LCL, MCL. Negative Mcmurray's, Apley's, and Thessalonian tests. painful patellar compression. Patellar glide with mild crepitus. Patellar and quadriceps tendons unremarkable. Hamstring and quadriceps strength is normal.  Contralateral knee unremarkable  MSK US  performed of: right  This study was ordered, performed, and interpreted by Terrilee Files D.O.  Knee: All structures visualized. Anteromedial, anterolateral, posteromedial, and posterolateral menisci unremarkable without tearing, fraying, effusion, or displacement patient's patellofemoral joint does have hypoechoic changes laterally. Patellar Tendon minimal hypoechoic changes but no increase in doppler flow.  No abnormality of prepatellar bursa. LCL and MCL unremarkable on long and transverse views. No abnormality of origin of medial or lateral head of the gastrocnemius. Unable to save pictures taken in EHR are but saved in internal hard drive  IMPRESSION:  mild patellofemoral syndrome, mild patella tendonitis.       Impression and Recommendations:     This case required medical decision making of moderate complexity.      Note: This dictation was prepared with Dragon dictation along with smaller phrase technology. Any transcriptional errors that result from this process are unintentional.

## 2016-04-26 NOTE — Assessment & Plan Note (Signed)
Mild patellofemoral syndrome noted. Some mild patellar tendinitis noted as well. Home exercises given, topical anti-inflammatories, we discussed icing regimen and home exercises. We discussed which activities to do in which ones to avoid.we discussed some biking can be beneficial. Patient will come back and see me again in 4 weeks for further evaluation and treatment.  Patellofemoral Syndrome  Reviewed anatomy using anatomical model and how PFS occurs.  Given rehab exercises handout for VMO, hip abductors, core, entire kinetic chain including proprioception exercises including cone touches, step downs, hip elevations and turn outs.  Could benefit from PT, regular exercise, upright biking, and a PFS knee brace to assist with tracking abnormalities.

## 2016-05-22 ENCOUNTER — Encounter: Payer: Self-pay | Admitting: Endocrinology

## 2016-05-22 ENCOUNTER — Ambulatory Visit (INDEPENDENT_AMBULATORY_CARE_PROVIDER_SITE_OTHER): Payer: 59 | Admitting: Endocrinology

## 2016-05-22 VITALS — BP 124/72 | HR 65 | Temp 98.3°F | Ht 70.0 in | Wt 192.2 lb

## 2016-05-22 DIAGNOSIS — IMO0002 Reserved for concepts with insufficient information to code with codable children: Secondary | ICD-10-CM

## 2016-05-22 DIAGNOSIS — E1165 Type 2 diabetes mellitus with hyperglycemia: Secondary | ICD-10-CM | POA: Diagnosis not present

## 2016-05-22 DIAGNOSIS — E1169 Type 2 diabetes mellitus with other specified complication: Secondary | ICD-10-CM

## 2016-05-22 DIAGNOSIS — E109 Type 1 diabetes mellitus without complications: Secondary | ICD-10-CM

## 2016-05-22 LAB — POCT GLYCOSYLATED HEMOGLOBIN (HGB A1C): Hemoglobin A1C: 6.5

## 2016-05-22 MED ORDER — GLUCOSE BLOOD VI STRP: ORAL_STRIP | Status: DC

## 2016-05-22 MED ORDER — INSULIN LISPRO 100 UNIT/ML ~~LOC~~ SOLN: SUBCUTANEOUS | Status: DC

## 2016-05-22 NOTE — Progress Notes (Signed)
Pre visit review using our clinic tool,if applicable. No additional management support is needed unless otherwise documented below in the visit note.  

## 2016-05-22 NOTE — Progress Notes (Signed)
Subjective:    Patient ID: Andrew Ruiz, male    DOB: [birth year], 25 y.o.   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  He has an enlite continuous glucose monitor.   Interval history:  He takes these pump settings:  basal rate of 0.7 units/hr, 24 hrs per day. mealtime bolus of 4-6 units, depending on the size of the meal (he prefers to take a general estimate of his mealtime bolus, rather than CHO counting) correction bolus of 1 unit for each 30 by which cbg exceeds 100.   He takes a total of approx 40 units per day.  pt states he feels well in general.  no cbg record, but states cbg's are well-controlled, except is is slightly higher fasting than later in the day.   Past Medical History  Diagnosis Date  . ADHD (attention deficit hyperactivity disorder)     [facility]  . Allergy   . Ventricular pre-excitation     on ekg card eval  no restrictions  . History of precordial chest pain   . Pilonidal abscess   . Diabetes mellitus without complication (HCC)   . Pilonidal cyst with abscess 08/28/2011    No past surgical history on file.  Social History   Social History  . Marital Status: Single    Spouse Name: N/A  . Number of Children: N/A  . Years of Education: N/A   Occupational History  . Not on file.   Social History Main Topics  . Smoking status: Never Smoker   . Smokeless tobacco: Not on file  . Alcohol Use: No  . Drug Use: No  . Sexual Activity: Not on file   Other Topics Concern  . Not on file   Social History Narrative   ** Merged History Encounter **       HH of 4   Pets Cats,dogs and birdWas at [employer]. 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    Lives alone in an apartment    Current Outpatient Prescriptions on File Prior to Visit  Medication Sig Dispense Refill  . ACCU-CHEK FASTCLIX LANCETS MISC     . glucagon (GLUCAGON EMERGENCY) 1 MG injection Inject 1 mg into the vein once as needed. 1 each 12  . ibuprofen (ADVIL,MOTRIN) 600 MG tablet Take 1 tablet (600 mg total) by mouth every 6 (six) hours as needed. 30 tablet 0  . Insulin Infusion Pump Supplies (INSULIN PUMP SYRINGE RESERVOIR) MISC 1 Device by Does not apply route every 3 (three) days. 30 each 3  . Insulin Infusion Pump Supplies (ULTRAFLEX) MISC 1 Device by Does not apply route every 3 (three) days. 80 cm tube length, 8 mm catheter length, ref # [id number] 10 each 1  . Melatonin 5 MG TABS Take by mouth. PRN Sleep 1-2 tabs    . Diclofenac Sodium (PENNSAID) 2 % SOLN Place 2 application onto the skin 2 (two) times daily. 112 g 3   No current facility-administered medications on file prior to visit.    No Known Allergies  Family  History  Problem Relation Age of Onset  . ADD / ADHD Brother   . Restless legs syndrome      grandmother on sinemet?    BP 124/72 mmHg  Pulse 65  Temp(Src) 98.3 F (36.8 C) (Oral)  Ht  (1.778 m)  Wt 192 lb 3.2 oz (87.181 kg)  BMI 27.58 kg/m2  SpO2 98%  Review of Systems He denies hypoglycemia    Objective:   Physical Exam VITAL SIGNS:  See vs page GENERAL: no distress Pulses: dorsalis pedis intact bilat.   MSK: no deformity of the feet CV: no leg edema Skin:  no ulcer on the feet.  normal color and temp on the feet. Neuro: sensation is intact to touch on the feet    A1c=6.5%    Assessment & Plan:  Type 1 DM: well-controlled.   Patient is advised the following: Patient Instructions  Please come back for a follow-up appointment in 3 months.   check your blood sugar 8 times a day--before the 3 meals, and at bedtime.  also check if you have  symptoms of your blood sugar being too high or too low.  please keep a record of the readings and bring it to your next appointment here.  please call us sooner if your blood sugar goes below 70, or if you have a lot of readings over 200.  continue basal rate of 0.7 units/hr, 24 hrs per day.  continue mealtime bolus of 4-6 units per meal. Subtract a few units if exercise is upcoming.  continue correction bolus (which some people call "sensitivity," or "insulin sensitivity ratio," or just "isr") of 1 unit for each by 30 which your glucose exceeds 120.    Please see Bonita Quin today, to plan for the new meter.  Romero Belling, MD

## 2016-05-22 NOTE — Patient Instructions (Addendum)
Please come back for a follow-up appointment in 3 months.   check your blood sugar 8 times a day--before the 3 meals, and at bedtime.  also check if you have symptoms of your blood sugar being too high or too low.  please keep a record of the readings and bring it to your next appointment here.  please call us sooner if your blood sugar goes below 70, or if you have a lot of readings over 200.  continue basal rate of 0.7 units/hr, 24 hrs per day.  continue mealtime bolus of 4-6 units per meal. Subtract a few units if exercise is upcoming.  continue correction bolus (which some people call "sensitivity," or "insulin sensitivity ratio," or just "isr") of 1 unit for each by 30 which your glucose exceeds 120.    Please see Bonita QuinLinda today, to plan for the new meter.

## 2016-07-04 ENCOUNTER — Telehealth: Payer: Self-pay | Admitting: Nutrition

## 2016-07-04 NOTE — Assessment & Plan Note (Signed)
Andrew Ruiz is starting the Auto mode of his pump.  He is dropping low.  Do you want to decrease his basal rate?  Current rate is 0.7 u/hr.

## 2016-07-04 NOTE — Telephone Encounter (Signed)
Please decrease to 0.6 units/hr, 24 hrs per day

## 2016-07-17 ENCOUNTER — Telehealth: Payer: Self-pay | Admitting: Endocrinology

## 2016-07-17 NOTE — Telephone Encounter (Signed)
I called and spoke with patient; he states that Dr.Ellison is his primary care doctor; he stated he took a benadryl in hopes to help with the reaction.

## 2016-07-17 NOTE — Telephone Encounter (Signed)
See note and please advise during Dr. Ellison's absence. Thanks!  

## 2016-07-17 NOTE — Telephone Encounter (Incomplete)
Patient states he took 3 Benadryl roughly 3 hours ago. At vet office and noticed rash on arm and eyes swelling, right eye. Denies airway  Benedryl q6

## 2016-07-17 NOTE — Telephone Encounter (Signed)
He needs to be seen by his PCP

## 2016-07-17 NOTE — Telephone Encounter (Signed)
At vet office earlier today and noticed rash on arm and eyes swelling. Per patient, he took 3 benadryl's roughly 3 hours ago. Patient states rash is now gone and swelling is now only in right eye. Patient denies shortness of breath, difficulty breathing, scratchy throat, swelling tongue, or any airway changes throughout entire duration of reaction. Spoke with Dr. Fabian SharpPanosh and she advised patient to take Benadryl q6h and apply cool compress. Patient is aware to call office in morning if symptoms have not been resolved. Patient also educated if any airway reactions begin to occur or if new symptoms arise, to go to nearest ED immediately. Patient verbalized understanding of all instructions.

## 2016-07-17 NOTE — Telephone Encounter (Signed)
Just received this message  He needs  OV.   With any one available asap and get more triage information  .     .[name]

## 2016-07-17 NOTE — Telephone Encounter (Signed)
PT called and said that he is having an allergic reaction, his whole eye is swollen shut and he wants to ask what he would be able to take to help with this. Requests call back.

## 2016-08-11 ENCOUNTER — Telehealth: Payer: Self-pay | Admitting: Endocrinology

## 2016-08-11 MED ORDER — INSULIN LISPRO 100 UNIT/ML ~~LOC~~ SOLN: SUBCUTANEOUS | 11 refills | Status: DC

## 2016-08-11 MED ORDER — INSULIN LISPRO 100 UNIT/ML (KWIKPEN): PEN_INJECTOR | SUBCUTANEOUS | 2 refills | Status: DC

## 2016-08-11 NOTE — Telephone Encounter (Signed)
PT is going out of town and needs his Humulog Pens sent to [facility] [address]

## 2016-08-11 NOTE — Telephone Encounter (Signed)
PT called back, he needs the refill sent in for the Humulog Pens not the Vials.

## 2016-08-11 NOTE — Telephone Encounter (Signed)
Refill submitted. 

## 2016-09-01 ENCOUNTER — Ambulatory Visit: Payer: Self-pay | Admitting: Endocrinology

## 2016-09-11 ENCOUNTER — Ambulatory Visit (INDEPENDENT_AMBULATORY_CARE_PROVIDER_SITE_OTHER): Payer: 59 | Admitting: Endocrinology

## 2016-09-11 ENCOUNTER — Encounter: Payer: Self-pay | Admitting: Endocrinology

## 2016-09-11 VITALS — BP 124/80 | HR 69 | Ht 70.0 in | Wt 197.0 lb

## 2016-09-11 DIAGNOSIS — E109 Type 1 diabetes mellitus without complications: Secondary | ICD-10-CM | POA: Diagnosis not present

## 2016-09-11 DIAGNOSIS — Z23 Encounter for immunization: Secondary | ICD-10-CM | POA: Diagnosis not present

## 2016-09-11 LAB — MICROALBUMIN / CREATININE URINE RATIO
CREATININE, U: 161.4 mg/dL
MICROALB/CREAT RATIO: 0.4 mg/g (ref 0.0–30.0)
Microalb, Ur: <0.7 mg/dL (ref 0.0–1.9)

## 2016-09-11 LAB — HM DIABETES EYE EXAM

## 2016-09-11 LAB — POCT GLYCOSYLATED HEMOGLOBIN (HGB A1C): HEMOGLOBIN A1C: 6.3

## 2016-09-11 NOTE — Progress Notes (Signed)
Subjective:    Patient ID: Andrew Ruiz, male    DOB: [birth year], 25 y.o.   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  He has an enlite continuous glucose monitor.   Interval history:  He takes these pump settings:  basal rate of 0.7 units/hr, 24 hrs per day. mealtime bolus of 4-6 units, depending on the size of the meal (he prefers to take a general estimate of his mealtime bolus, rather than CHO counting) correction bolus of 1 unit for each 30 by which cbg exceeds 100.   He takes a total of approx 40 units per day.  He has mild hypoglycemia approx once every few months, and these episodes are mild.  There is no trend throughout the day.  Past Medical History:  Diagnosis Date  . ADHD (attention deficit hyperactivity disorder)    [facility]  . Allergy   . Diabetes mellitus without complication (HCC)   . History of precordial chest pain   . Pilonidal abscess   . Pilonidal cyst with abscess 08/28/2011  . Ventricular pre-excitation    on ekg card eval  no restrictions    No past surgical history on file.  Social History   Social History  . Marital status: Single    Spouse name: N/A  . Number of children: N/A  . Years of education: N/A   Occupational History  . Not on file.   Social History Main Topics  . Smoking status: Never Smoker  . Smokeless tobacco: Not on file  . Alcohol use No  . Drug use: No  . Sexual activity: Not on file   Other Topics Concern  . Not on file   Social History Narrative   ** Merged History Encounter **       HH of 4   Pets Cats,dogs and birdWas at [employer]. 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    Lives alone in an apartment    Current Outpatient Prescriptions on File Prior to Visit  Medication Sig Dispense Refill  . ACCU-CHEK FASTCLIX LANCETS MISC     . Diclofenac Sodium (PENNSAID) 2 % SOLN Place 2 application onto the skin 2 (two) times daily. 112 g 3  . glucagon (GLUCAGON EMERGENCY) 1 MG injection Inject 1 mg into the vein once as needed. 1 each 12  . glucose blood (BAYER CONTOUR NEXT TEST) test strip 8 times per day, and lancets 8/day 240 each 12  . ibuprofen (ADVIL,MOTRIN) 600 MG tablet Take 1 tablet (600 mg total) by mouth every 6 (six) hours as needed. 30 tablet 0  . Insulin Infusion Pump Supplies (INSULIN PUMP SYRINGE RESERVOIR) MISC 1 Device by Does not apply route every 3 (three) days. 30 each 3  . Insulin Infusion Pump Supplies (ULTRAFLEX) MISC 1 Device by Does not apply route every 3 (three) days. 80 cm tube length, 8 mm catheter length, ref # [id number] 10 each 1  . insulin lispro (HUMALOG KWIKPEN) 100 UNIT/ML KiwkPen 60 units per day. 30 mL 2  . insulin lispro (HUMALOG) 100 UNIT/ML  injection FOR USE IN PUMP, TOTAL OF 60 UNITS PER DAY. 20 mL 11  . Melatonin 5 MG TABS Take by mouth. PRN Sleep 1-2 tabs     No current facility-administered medications on file prior to visit.     No Known Allergies  Family History  Problem Relation Age of Onset  . ADD / ADHD Brother   . Restless legs syndrome      grandmother on sinemet?    BP 124/80   Pulse 69   Ht 5\' 10"  (1.778 m)   Wt 197 lb (89.4 kg)   SpO2 95%   BMI 28.27 kg/m    Review of Systems No LOC    Objective:   Physical Exam VITAL SIGNS:  See vs page GENERAL: no distress Pulses: dorsalis pedis intact bilat.   MSK: no deformity of the feet CV: no leg edema Skin:  no ulcer on the feet.  normal color and temp on the feet. Neuro: sensation is intact to touch on the feet  Lab Results  Component Value Date   HGBA1C 6.3 09/11/2016      Assessment & Plan:    Type 1 DM: well-controlled.  Please continue the same pump settings.

## 2016-09-11 NOTE — Patient Instructions (Signed)
Please come back for a follow-up appointment in 4 months.   check your blood sugar 8 times a day--before the 3 meals, and at bedtime.  also check if you have symptoms of your blood sugar being too high or too low.  please keep a record of the readings and bring it to your next appointment here.  please call us sooner if your blood sugar goes below 70, or if you have a lot of readings over 200.  continue basal rate of 0.7 units/hr, 24 hrs per day.  continue mealtime bolus of 4-6 units per meal. Subtract a few units if exercise is upcoming.  continue correction bolus (which some people call "sensitivity," or "insulin sensitivity ratio," or just "isr") of 1 unit for each by 30 which your glucose exceeds 120.

## 2016-12-13 ENCOUNTER — Ambulatory Visit (INDEPENDENT_AMBULATORY_CARE_PROVIDER_SITE_OTHER): Payer: 59 | Admitting: Endocrinology

## 2016-12-13 ENCOUNTER — Encounter: Payer: Self-pay | Admitting: Endocrinology

## 2016-12-13 VITALS — BP 126/80 | HR 82 | Ht 70.0 in | Wt 206.0 lb

## 2016-12-13 DIAGNOSIS — E109 Type 1 diabetes mellitus without complications: Secondary | ICD-10-CM

## 2016-12-13 LAB — POCT GLYCOSYLATED HEMOGLOBIN (HGB A1C): HEMOGLOBIN A1C: 7.3

## 2016-12-13 MED ORDER — ONDANSETRON HCL 4 MG PO TABS: 4.0000 mg | ORAL_TABLET | Freq: Three times a day (TID) | ORAL | 0 refills | Status: DC | PRN

## 2016-12-13 MED ORDER — INSULIN LISPRO 100 UNIT/ML (KWIKPEN): PEN_INJECTOR | SUBCUTANEOUS | 2 refills | Status: DC

## 2016-12-13 NOTE — Patient Instructions (Addendum)
Please come back for a follow-up appointment in 3 months.   check your blood sugar 8 times a day--before the 3 meals, and at bedtime.  also check if you have symptoms of your blood sugar being too high or too low.  please keep a record of the readings and bring it to your next appointment here.  please call us sooner if your blood sugar goes below 70, or if you have a lot of readings over 200.  continue basal rate of 1 unit/hr, 24 hrs per day.  continue mealtime bolus of 4-6 units per meal. Subtract a few units if exercise is upcoming.   continue correction bolus (which some people call "sensitivity," or "insulin sensitivity ratio," or just "isr") of 1 unit for each by 30 which your glucose exceeds 120.   You will feel better soon.  When that happens, it is expected that your insulin requirement will return to normal, so you should then reduce the basal back to 0.7 units/hr.   I have sent a prescription to your pharmacy, for the nausea.

## 2016-12-13 NOTE — Progress Notes (Signed)
Subjective:    Patient ID: Andrew Ruiz, male    DOB: 03-Jan-1991, 26 y.o.   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  He has an enlite continuous glucose monitor.   Interval history:  He takes these pump settings:  basal rate of 0.7 units/hr, 24 hrs per day. mealtime bolus of 4-6 units, depending on the size of the meal (he prefers to take a general estimate of his mealtime bolus, rather than CHO counting) correction bolus of 1 unit for each 30 by which cbg exceeds 100.   He takes a total of approx 70 units per day.  Pt says cbg's have been 200-400 for the past few days (he has URI).  There is no trend throughout the day.  This is despite increasing the insulin.   Past Medical History:  Diagnosis Date  . ADHD (attention deficit hyperactivity disorder)    [facility]  . Allergy   . Diabetes mellitus without complication (HCC)   . History of precordial chest pain   . Pilonidal abscess   . Pilonidal cyst with abscess 08/28/2011  . Ventricular pre-excitation    on ekg card eval  no restrictions    No past surgical history on file.  Social History   Social History  . Marital status: Single    Spouse name: N/A  . Number of children: N/A  . Years of education: N/A   Occupational History  . Not on file.   Social History Main Topics  . Smoking status: Never Smoker  . Smokeless tobacco: Not on file  . Alcohol use No  . Drug use: No  . Sexual activity: Not on file   Other Topics Concern  . Not on file   Social History Narrative   ** Merged History Encounter **       HH of 4   Pets Cats,dogs and birdWas at [employer]. 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    Lives alone in an apartment    Current Outpatient Prescriptions on File Prior to Visit  Medication Sig Dispense Refill  . ACCU-CHEK FASTCLIX LANCETS MISC     . Diclofenac Sodium (PENNSAID) 2 % SOLN Place 2 application onto the skin 2 (two) times daily. 112 g 3  . glucagon (GLUCAGON EMERGENCY) 1 MG injection Inject 1 mg into the vein once as needed. 1 each 12  . glucose blood (BAYER CONTOUR NEXT TEST) test strip 8 times per day, and lancets 8/day 240 each 12  . ibuprofen (ADVIL,MOTRIN) 600 MG tablet Take 1 tablet (600 mg total) by mouth every 6 (six) hours as needed. 30 tablet 0  . Insulin Infusion Pump Supplies (INSULIN PUMP SYRINGE RESERVOIR) MISC 1 Device by Does not apply route every 3 (three) days. 30 each 3  . Insulin Infusion Pump Supplies (ULTRAFLEX) MISC 1 Device by Does not apply route every 3 (three) days. 80 cm tube length, 8 mm catheter length, ref # [id number] 10 each 1  . insulin lispro (HUMALOG) 100 UNIT/ML injection FOR USE IN PUMP, TOTAL OF 60  UNITS PER DAY. 20 mL 11  . Melatonin 5 MG TABS Take by mouth. PRN Sleep 1-2 tabs     No current facility-administered medications on file prior to visit.     No Known Allergies  Family History  Problem Relation Age of Onset  . ADD / ADHD Brother   . Restless legs syndrome      grandmother on sinemet?    BP 126/80   Pulse 82   Ht 5\' 10"  (1.778 m)   Wt 206 lb (93.4 kg)   SpO2 98%   BMI 29.56 kg/m    Review of Systems He denies hypoglycemia.  He has slight nausea.       Objective:   Physical Exam VITAL SIGNS:  See vs page GENERAL: no distress Pulses: dorsalis pedis intact bilat.   MSK: no deformity of the feet CV: no leg edema Skin:  no ulcer on the feet.  normal color and temp on the feet. Neuro: sensation is intact to touch on the feet.   Lab Results  Component Value Date   HGBA1C 7.3 12/13/2016      Assessment & Plan:  Type 1 DM:  worse URI, new.  This is worsening glycemic control.    Patient is advised the following: Patient Instructions  Please come back for a follow-up appointment in 3 months.   check your blood sugar 8 times a day--before the 3 meals, and at bedtime.  also check if you have symptoms of your blood sugar being too high or too low.  please keep a record of the readings and bring it to your next appointment here.  please call us sooner if your blood sugar goes below 70, or if you have a lot of readings over 200.  continue basal rate of 1 unit/hr, 24 hrs per day.  continue mealtime bolus of 4-6 units per meal. Subtract a few units if exercise is upcoming.   continue correction bolus (which some people call "sensitivity," or "insulin sensitivity ratio," or just "isr") of 1 unit for each by 30 which your glucose exceeds 120.   You will feel better soon.  When that happens, it is expected that your insulin requirement will return to normal, so you should then reduce the basal back to 0.7 units/hr.   I have sent a prescription to your pharmacy, for the nausea.

## 2016-12-15 ENCOUNTER — Other Ambulatory Visit: Payer: Self-pay

## 2016-12-15 ENCOUNTER — Telehealth: Payer: Self-pay | Admitting: Endocrinology

## 2016-12-15 MED ORDER — INSULIN LISPRO 100 UNIT/ML ~~LOC~~ SOLN: SUBCUTANEOUS | 11 refills | Status: DC

## 2016-12-15 NOTE — Telephone Encounter (Signed)
Refill submitted. 

## 2016-12-15 NOTE — Telephone Encounter (Signed)
Patient stated he need Humalog 80 unit vials instead of pens.

## 2017-01-23 ENCOUNTER — Telehealth: Payer: Self-pay | Admitting: Endocrinology

## 2017-01-23 MED ORDER — INSULIN LISPRO 100 UNIT/ML ~~LOC~~ SOLN: SUBCUTANEOUS | 11 refills | Status: DC

## 2017-01-23 NOTE — Telephone Encounter (Signed)
#   [phone] [facility]

## 2017-01-23 NOTE — Telephone Encounter (Signed)
Refill submitted. 

## 2017-01-23 NOTE — Telephone Encounter (Signed)
Refill submitted for 3 vials of insulin.

## 2017-01-23 NOTE — Telephone Encounter (Signed)
Pt states he is to get 3 vials of insulin a month, please advise, asked the pt what he is to insert in his insulin pump he states there has been no basal changes just that he needs 3 vials of insulin and not 2

## 2017-01-23 NOTE — Telephone Encounter (Signed)
Pt needs the humalog 100 u vials [facility] [address]  He is going to call back with the number

## 2017-01-24 DIAGNOSIS — E109 Type 1 diabetes mellitus without complications: Secondary | ICD-10-CM | POA: Diagnosis not present

## 2017-03-09 DIAGNOSIS — E109 Type 1 diabetes mellitus without complications: Secondary | ICD-10-CM | POA: Diagnosis not present

## 2017-04-19 DIAGNOSIS — E109 Type 1 diabetes mellitus without complications: Secondary | ICD-10-CM | POA: Diagnosis not present

## 2017-05-26 ENCOUNTER — Other Ambulatory Visit: Payer: Self-pay | Admitting: Endocrinology

## 2017-06-18 ENCOUNTER — Encounter (HOSPITAL_BASED_OUTPATIENT_CLINIC_OR_DEPARTMENT_OTHER): Payer: Self-pay | Admitting: *Deleted

## 2017-06-18 ENCOUNTER — Emergency Department (HOSPITAL_BASED_OUTPATIENT_CLINIC_OR_DEPARTMENT_OTHER)
Admission: EM | Admit: 2017-06-18 | Discharge: 2017-06-19 | Disposition: A | Discharge status: Home / Self Care | Payer: 59 | Attending: Emergency Medicine | Admitting: Emergency Medicine

## 2017-06-18 DIAGNOSIS — Z794 Long term (current) use of insulin: Secondary | ICD-10-CM | POA: Insufficient documentation

## 2017-06-18 DIAGNOSIS — K529 Noninfective gastroenteritis and colitis, unspecified: Secondary | ICD-10-CM | POA: Insufficient documentation

## 2017-06-18 DIAGNOSIS — R509 Fever, unspecified: Secondary | ICD-10-CM | POA: Diagnosis present

## 2017-06-18 DIAGNOSIS — R739 Hyperglycemia, unspecified: Secondary | ICD-10-CM

## 2017-06-18 DIAGNOSIS — E109 Type 1 diabetes mellitus without complications: Secondary | ICD-10-CM | POA: Diagnosis not present

## 2017-06-18 DIAGNOSIS — Z9641 Presence of insulin pump (external) (internal): Secondary | ICD-10-CM | POA: Insufficient documentation

## 2017-06-18 DIAGNOSIS — R109 Unspecified abdominal pain: Secondary | ICD-10-CM | POA: Diagnosis not present

## 2017-06-18 DIAGNOSIS — E1165 Type 2 diabetes mellitus with hyperglycemia: Secondary | ICD-10-CM | POA: Diagnosis not present

## 2017-06-18 LAB — URINALYSIS, ROUTINE W REFLEX MICROSCOPIC
Bilirubin Urine: NEGATIVE
Glucose, UA: >=500 mg/dL — AB
KETONES UR: 15 mg/dL — AB
LEUKOCYTES UA: NEGATIVE
Nitrite: NEGATIVE
PH: 6 (ref 5.0–8.0)
Protein, ur: NEGATIVE mg/dL
SPECIFIC GRAVITY, URINE: 1.039 — AB (ref 1.005–1.030)

## 2017-06-18 LAB — CBC
HCT: 43.1 % (ref 39.0–52.0)
Hemoglobin: 15.5 g/dL (ref 13.0–17.0)
MCH: 30.1 pg (ref 26.0–34.0)
MCHC: 36 g/dL (ref 30.0–36.0)
MCV: 83.7 fL (ref 78.0–100.0)
PLATELETS: 203 10*3/uL (ref 150–400)
RBC: 5.15 MIL/uL (ref 4.22–5.81)
RDW: 12.3 % (ref 11.5–15.5)
WBC: 8.7 10*3/uL (ref 4.0–10.5)

## 2017-06-18 LAB — URINALYSIS, MICROSCOPIC (REFLEX): WBC, UA: NONE SEEN WBC/hpf (ref 0–5)

## 2017-06-18 LAB — CBG MONITORING, ED: Glucose-Capillary: 276 mg/dL — ABNORMAL HIGH (ref 65–99)

## 2017-06-18 NOTE — ED Provider Notes (Signed)
TIME SEEN: 11:58 PM  By signing my name below, I, Cynda AcresHailei Fulton, attest that this documentation has been prepared under the direction and in the presence of Corion Sherrod, Layla MawKristen N, DO. Electronically Signed: Cynda AcresHailei Fulton, Scribe. 06/19/17. 12:03 AM.  CHIEF COMPLAINT: Fever   HPI: Andrew Ruiz is a 26 y.o. male with a history of type I diabetes with an insulin pump who presents to the Emergency Department complaining of sudden-onset, persistent fever (102.7 max) that began earlier today. Patient states he initially began feeling nauseous with both lower abdominal pain that he describes as cramping and diarrhea. Patient does report going to the beach recently, where he cut his right foot on seashells. Patient reports associated fatigue, headache, body aches. Patient reports taking Zofran and ibuprofen at home with some relief. Patient denies any history of DKA. Patient denies any cough, dysuria, hematuria, rash, or any additional symptoms. No sick contacts. No recent travel. No tick bites. Denies bloody stool or melena.  ROS: See HPI Constitutional:  fever  Eyes: no drainage  ENT: no runny nose   Cardiovascular:  no chest pain  Resp: no SOB  GI: no vomiting GU: no dysuria Integumentary: no rash  Allergy: no hives  Musculoskeletal: no leg swelling  Neurological: no slurred speech ROS otherwise negative  PAST MEDICAL HISTORY/PAST SURGICAL HISTORY:  Past Medical History:  Diagnosis Date  . ADHD (attention deficit hyperactivity disorder)    eval [facility] no ld  . Allergy   . Diabetes mellitus without complication (HCC)   . History of precordial chest pain   . Pilonidal abscess   . Pilonidal cyst with abscess 08/28/2011  . Ventricular pre-excitation    on ekg card eval  no restrictions    MEDICATIONS:  Prior to Admission medications   Medication Sig Start Date End Date Taking? Authorizing Provider  ondansetron (ZOFRAN) 4 MG tablet Take 1 tablet (4 mg total) by mouth every 8  (eight) hours as needed for nausea or vomiting. 12/13/16  Yes Romero BellingEllison, Sean, MD  ACCU-CHEK FASTCLIX LANCETS MISC  07/29/13   [provider]  cetirizine (ZYRTEC) 10 MG tablet Take 10 mg by mouth daily.    [provider]  CONTOUR NEXT TEST test strip TEST 8 TIMES PER DAY, AND LANCETS 8/DAY 05/26/17   Romero BellingEllison, Sean, MD  Diclofenac Sodium (PENNSAID) 2 % SOLN Place 2 application onto the skin 2 (two) times daily. 04/26/16   Judi SaaSmith, Zachary M, DO  glucagon (GLUCAGON EMERGENCY) 1 MG injection Inject 1 mg into the vein once as needed. 07/30/13   Romero BellingEllison, Sean, MD  ibuprofen (ADVIL,MOTRIN) 600 MG tablet Take 1 tablet (600 mg total) by mouth every 6 (six) hours as needed. 01/29/14   Brandt LoosenManly, Julie, MD  Insulin Infusion Pump Supplies (INSULIN PUMP SYRINGE RESERVOIR) MISC 1 Device by Does not apply route every 3 (three) days. 10/16/14   Romero BellingEllison, Sean, MD  Insulin Infusion Pump Supplies (ULTRAFLEX) MISC 1 Device by Does not apply route every 3 (three) days. 80 cm tube length, 8 mm catheter length, ref # [id number] 10/16/14   Romero BellingEllison, Sean, MD  insulin lispro (HUMALOG KWIKPEN) 100 UNIT/ML KiwkPen For use in pen, for a total of 80 units per day. 12/13/16   Romero BellingEllison, Sean, MD  insulin lispro (HUMALOG) 100 UNIT/ML injection FOR USE IN PUMP, TOTAL OF 80 UNITS PER DAY. 01/23/17   Romero BellingEllison, Sean, MD  Melatonin 5 MG TABS Take by mouth. PRN Sleep 1-2 tabs    [provider]  ALLERGIES:  No Known Allergies  SOCIAL HISTORY:  Social History  Substance Use Topics  . Smoking status: Never Smoker  . Smokeless tobacco: Not on file  . Alcohol use No    FAMILY HISTORY: Family History  Problem Relation Age of Onset  . ADD / ADHD Brother   . Restless legs syndrome Unknown        grandmother on sinemet?    EXAM: BP 136/88 (BP Location: Right Arm)   Pulse 91   Temp 98.5 F (36.9 C) (Oral)   Resp 18   Ht 5\' 9"  (1.753 m)   Wt 190 lb (86.2 kg)   SpO2 99%   BMI 28.06 kg/m  CONSTITUTIONAL: Alert  and oriented and responds appropriately to questions. Well-appearing; well-nourished, Febrile but nontoxic HEAD: Normocephalic EYES: Conjunctivae clear, pupils appear equal, EOMI ENT: normal nose; moist mucous membranes NECK: Supple, no meningismus, no nuchal rigidity, no LAD  CARD: regular and tachycardic; S1 and S2 appreciated; no murmurs, no clicks, no rubs, no gallops RESP: Normal chest excursion without splinting or tachypnea; breath sounds clear and equal bilaterally; no wheezes, no rhonchi, no rales, no hypoxia or respiratory distress, speaking full sentences ABD/GI: Normal bowel sounds; non-distended; soft, tenderness to the lower abdomen, no rebound, no guarding, no peritoneal signs, no hepatosplenomegaly BACK:  The back appears normal and is non-tender to palpation, there is no CVA tenderness EXT: Normal ROM in all joints; non-tender to palpation; no edema; normal capillary refill; no cyanosis, no calf tenderness or swelling, there is no redness, warmth or open wound noted to the feet, 2+ DP pulses bilaterally, no joint effusion, compartments are soft, no cellulitis    SKIN: Normal color for age and race; warm; no rash NEURO: Moves all extremities equally PSYCH: The patient's mood and manner are appropriate. Grooming and personal hygiene are appropriate.  MEDICAL DECISION MAKING: Patient here with fever. He complains of diarrhea, nausea and lower abdominal pain. Labs show mild hyperglycemia without DKA. Urine shows no sign of infection. Will obtain CT scan for further evaluation as appendicitis, colitis, diverticulitis or almond differential. Doubt meningitis. Doubt pneumonia. He is otherwise well-appearing. We'll give IV fluids, Zofran. He declines pain medication.  ED PROGRESS: CT scan shows ileitis and colitis. I favor that this is infectious in etiology given his fever. Appendix is not well visualized but he does not have specific tenderness at McBurney's point. I did discuss this with  family and we discussed at length return precautions. I do not have strong suspicion that this is appendicitis. He denies having blood in his stool or melena. He denies history of Crohn's or ulcer colitis for himself or family members. He does not have a GI doctor but does have a primary care physician. Have offered patient admission but at this time he declines. He states he would like to go home. Have recommended close glucose control at home, increase water intake, bland diet. We'll discharge with Cipro, Flagyl for the next 10 days. Have instructed him to alternate Tylenol and Motrin for pain and fever. Also given prescriptions for tramadol, Zofran. He will use over-the-counter Imodium for diarrhea. Given outpatient GI follow-up as well. Discussed at length strict return precautions. Patient and family are comfortable with this plan.  At this time, I do not feel there is any life-threatening condition present. I have reviewed and discussed all results (EKG, imaging, lab, urine as appropriate) and exam findings with patient/family. I have reviewed nursing notes and appropriate previous records.  I feel  the patient is safe to be discharged home without further emergent workup and can continue workup as an outpatient as needed. Discussed usual and customary return precautions. Patient/family verbalize understanding and are comfortable with this plan.  Outpatient follow-up has been provided if needed. All questions have been answered.  I personally performed the services described in this documentation, which was scribed in my presence. The recorded information has been reviewed and is accurate.     Marshall Roehrich, Layla Maw, DO 06/19/17 [phone]

## 2017-06-18 NOTE — ED Triage Notes (Signed)
Pt c/o dizziness and n/d and abd pain  CBG at home 230

## 2017-06-19 ENCOUNTER — Encounter (HOSPITAL_BASED_OUTPATIENT_CLINIC_OR_DEPARTMENT_OTHER): Payer: Self-pay | Admitting: Respiratory Therapy

## 2017-06-19 ENCOUNTER — Emergency Department (HOSPITAL_BASED_OUTPATIENT_CLINIC_OR_DEPARTMENT_OTHER): Payer: 59

## 2017-06-19 DIAGNOSIS — R109 Unspecified abdominal pain: Secondary | ICD-10-CM | POA: Diagnosis not present

## 2017-06-19 LAB — COMPREHENSIVE METABOLIC PANEL
ALBUMIN: 4.2 g/dL (ref 3.5–5.0)
ALT: 22 U/L (ref 17–63)
AST: 20 U/L (ref 15–41)
Alkaline Phosphatase: 54 U/L (ref 38–126)
Anion gap: 9 (ref 5–15)
BILIRUBIN TOTAL: 1 mg/dL (ref 0.3–1.2)
BUN: 15 mg/dL (ref 6–20)
CHLORIDE: 102 mmol/L (ref 101–111)
CO2: 24 mmol/L (ref 22–32)
Calcium: 8.7 mg/dL — ABNORMAL LOW (ref 8.9–10.3)
Creatinine, Ser: 1.11 mg/dL (ref 0.61–1.24)
GFR calc Af Amer: >60 mL/min (ref >60)
GFR calc non Af Amer: >60 mL/min (ref >60)
GLUCOSE: 238 mg/dL — AB (ref 65–99)
POTASSIUM: 4.1 mmol/L (ref 3.5–5.1)
Sodium: 135 mmol/L (ref 135–145)
TOTAL PROTEIN: 7.4 g/dL (ref 6.5–8.1)

## 2017-06-19 LAB — LIPASE, BLOOD: Lipase: 19 U/L (ref 11–51)

## 2017-06-19 LAB — CBG MONITORING, ED: Glucose-Capillary: 213 mg/dL — ABNORMAL HIGH (ref 65–99)

## 2017-06-19 MED ORDER — ACETAMINOPHEN 500 MG PO TABS
1000.0000 mg | ORAL_TABLET | Freq: Once | ORAL | Status: AC
  Administered 2017-06-19: 1000 mg via ORAL
  Filled 2017-06-19: qty 2

## 2017-06-19 MED ORDER — TRAMADOL HCL 50 MG PO TABS: 50.0000 mg | ORAL_TABLET | Freq: Four times a day (QID) | ORAL | 0 refills | Status: DC | PRN

## 2017-06-19 MED ORDER — SODIUM CHLORIDE 0.9 % IV BOLUS (SEPSIS)
1000.0000 mL | Freq: Once | INTRAVENOUS | Status: AC
  Administered 2017-06-19: 1000 mL via INTRAVENOUS

## 2017-06-19 MED ORDER — CIPROFLOXACIN HCL 500 MG PO TABS
500.0000 mg | ORAL_TABLET | Freq: Once | ORAL | Status: AC
  Administered 2017-06-19: 500 mg via ORAL
  Filled 2017-06-19: qty 1

## 2017-06-19 MED ORDER — METRONIDAZOLE 500 MG PO TABS: 500.0000 mg | ORAL_TABLET | Freq: Three times a day (TID) | ORAL | 0 refills | Status: DC

## 2017-06-19 MED ORDER — ONDANSETRON 4 MG PO TBDP: 4.0000 mg | ORAL_TABLET | Freq: Three times a day (TID) | ORAL | 0 refills | Status: DC | PRN

## 2017-06-19 MED ORDER — LOPERAMIDE HCL 2 MG PO CAPS
4.0000 mg | ORAL_CAPSULE | Freq: Once | ORAL | Status: AC
  Administered 2017-06-19: 4 mg via ORAL
  Filled 2017-06-19: qty 2

## 2017-06-19 MED ORDER — CIPROFLOXACIN HCL 500 MG PO TABS: 500.0000 mg | ORAL_TABLET | Freq: Two times a day (BID) | ORAL | 0 refills | Status: DC

## 2017-06-19 MED ORDER — ONDANSETRON HCL 4 MG/2ML IJ SOLN
4.0000 mg | Freq: Once | INTRAMUSCULAR | Status: AC
  Administered 2017-06-19: 4 mg via INTRAVENOUS
  Filled 2017-06-19: qty 2

## 2017-06-19 MED ORDER — METRONIDAZOLE 500 MG PO TABS
500.0000 mg | ORAL_TABLET | Freq: Once | ORAL | Status: AC
  Administered 2017-06-19: 500 mg via ORAL
  Filled 2017-06-19: qty 1

## 2017-06-19 MED ORDER — IOPAMIDOL (ISOVUE-300) INJECTION 61%
100.0000 mL | Freq: Once | INTRAVENOUS | Status: AC | PRN
  Administered 2017-06-19: 100 mL via INTRAVENOUS

## 2017-06-19 NOTE — Discharge Instructions (Signed)
You may alternate Tylenol 1000 mg every 6 hours as needed for pain and Ibuprofen 800 mg every 8 hours as needed for pain.  Please take Ibuprofen with food.   You may use over-the-counter Imodium for diarrhea.   I recommend close follow-up with your primary care physician as well as gastroenterology. If your symptoms worsen and you have increasing pain, vomiting and cannot keep down medication, have blood in your stool or any other concern to you, please return to the hospital.

## 2017-06-20 ENCOUNTER — Encounter: Payer: Self-pay | Admitting: Internal Medicine

## 2017-06-20 ENCOUNTER — Encounter: Payer: Self-pay | Admitting: Emergency Medicine

## 2017-06-20 ENCOUNTER — Encounter: Payer: Self-pay | Admitting: Endocrinology

## 2017-06-20 ENCOUNTER — Ambulatory Visit (INDEPENDENT_AMBULATORY_CARE_PROVIDER_SITE_OTHER): Payer: 59 | Admitting: Internal Medicine

## 2017-06-20 VITALS — BP 104/80 | HR 86 | Temp 98.1°F | Wt 196.2 lb

## 2017-06-20 DIAGNOSIS — K529 Noninfective gastroenteritis and colitis, unspecified: Secondary | ICD-10-CM | POA: Diagnosis not present

## 2017-06-20 DIAGNOSIS — R509 Fever, unspecified: Secondary | ICD-10-CM

## 2017-06-20 DIAGNOSIS — G2581 Restless legs syndrome: Secondary | ICD-10-CM | POA: Insufficient documentation

## 2017-06-20 DIAGNOSIS — E109 Type 1 diabetes mellitus without complications: Secondary | ICD-10-CM | POA: Diagnosis not present

## 2017-06-20 NOTE — Patient Instructions (Addendum)
  Hopefully this is an acute infection that will resolve with time and antibiotics. Monitor your temperature at night or if you're getting chills.  Expect a fever to be gone in the next 2 days if it is not always going away I'm going to put a referral in to have gastroneurology see you in follow-up as we discussed.   Let us know  If not  Improving   by Friday   Am .  The antibiotics you are on  Do treat a numbner of possibe bacteria  That could cause this problem .     I will contact Dr. George HughEllison's office about any other advice on your diabetes control. We'll review her chart about the restless leg on first glance I don't see medication that was prescribed there are other things we can do after you this acute problem is resolved.    Ct reports  Stomach/Bowel: Stomach is nonenlarged. No dilated small bowel. Terminal ileal thickening. Mild wall thickening of the cecum, ascending colon, hepatic flexure and transverse colon. Appendix not well visualized.  Vascular/Lymphatic: Non aneurysmal aorta.Prominent right lower quadrant mesenteric lymph nodes, measuring up to 1.5 cm.  Reproductive: Prostate gland calcification.  Other: No free air or free fluid  Musculoskeletal: No acute or significant osseous findings.  IMPRESSION: 1. Terminal ileal thickening and mild wall thickening of the right colon and transverse colon consistent with ileitis and colitis which may be secondary to infection or inflammatory bowel disease. 2. Multiple right lower quadrant mesenteric nodes are likely reactive   Electronically Signed   By: Jasmine PangKim  Fujinaga M.D.   On: 06/19/2017 02:59

## 2017-06-20 NOTE — Progress Notes (Signed)
Chief Complaint  Patient presents with  . Hospitalization Follow-up    patients states that he is not feeling better     HPI: Andrew Ruiz 26 y.o. come in for fu ed visit for  Dm out of control and  g sx cw colitis     Given cipro and flagyl  And to to have fu  OP  And told to alternate tylenola nd motrin?  He had fver  Last ov 5 17   Onset on the evening of July 9 where he had fever and chills. He went to work but started feeling bad and his temperature was up to 102 and not responding to anti-inflammatories. His blood sugar was hard to control also over 200s. No vomiting. He went to the emergency room in that the emergency room developed frequent watery diarrhea and then developed abdominal pain and cramps. He was evaluated lab work and a CT scan which showed ileitis colitis differential diagnosis infection or inflammatory bowel disease. Since that time he has been taking metronidazole 500 3 times a day and Cipro twice a day and doesn't feel any better feels feverish but didn't take any ibuprofen this morning. His abdominal discomfort and pain is about the same. There is no vomiting there is no blood Uterus never had this before nor had difficulty with GI symptoms. Negative family history of inflammatory bowel disease. He did travel to the beach on file previously no one else got sick no undercooked seafood did have a shell cut but no wound infection. Since this infection he's having a hard time keeping his sugars down there about the 200s despite increasing his insulin.   he wanted to mention it is restless leg he 39 got sick was coming more problematic keeping him up at night only bothersome in the day. I saw him for this in 2016 but hasn't had follow-up.  ROS: See pertinent positives and negatives per HPI. No vomiting  Rash  Cough resp sx  Vomiting .   Past Medical History:  Diagnosis Date  . ADHD (attention deficit hyperactivity disorder)    [facility]  .  Allergy   . Diabetes mellitus without complication (HCC)   . History of precordial chest pain   . Pilonidal abscess   . Pilonidal cyst with abscess 08/28/2011  . Ventricular pre-excitation    on ekg card eval  no restrictions    Family History  Problem Relation Age of Onset  . ADD / ADHD Brother   . Restless legs syndrome Unknown        grandmother on sinemet?    Social History   Social History  . Marital status: Single    Spouse name: N/A  . Number of children: N/A  . Years of education: N/A   Social History Main Topics  . Smoking status: Never Smoker  . Smokeless tobacco: None  . Alcohol use No  . Drug use: No  . Sexual activity: Not Asked   Other Topics Concern  . None   Social History Narrative   ** Merged History Encounter **       HH of 4   Pets Cats,dogs and birdWas at [employer]. 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    Lives alone in an apartment    Outpatient Medications Prior to Visit  Medication Sig Dispense Refill  .  ACCU-CHEK FASTCLIX LANCETS MISC     . cetirizine (ZYRTEC) 10 MG tablet Take 10 mg by mouth daily.    . ciprofloxacin (CIPRO) 500 MG tablet Take 1 tablet (500 mg total) by mouth 2 (two) times daily. 20 tablet 0  . CONTOUR NEXT TEST test strip TEST 8 TIMES PER DAY, AND LANCETS 8/DAY 240 each 11  . Diclofenac Sodium (PENNSAID) 2 % SOLN Place 2 application onto the skin 2 (two) times daily. 112 g 3  . glucagon (GLUCAGON EMERGENCY) 1 MG injection Inject 1 mg into the vein once as needed. 1 each 12  . ibuprofen (ADVIL,MOTRIN) 600 MG tablet Take 1 tablet (600 mg total) by mouth every 6 (six) hours as needed. 30 tablet 0  . Insulin Infusion Pump Supplies (INSULIN PUMP SYRINGE RESERVOIR) MISC 1 Device by Does not apply route every 3 (three) days. 30 each 3  . Insulin Infusion Pump Supplies (ULTRAFLEX) MISC 1 Device by Does not apply route every 3 (three) days. 80 cm tube length, 8 mm catheter  length, ref # [id number] 10 each 1  . insulin lispro (HUMALOG KWIKPEN) 100 UNIT/ML KiwkPen For use in pen, for a total of 80 units per day. 30 mL 2  . insulin lispro (HUMALOG) 100 UNIT/ML injection FOR USE IN PUMP, TOTAL OF 80 UNITS PER DAY. 30 mL 11  . Melatonin 5 MG TABS Take by mouth. PRN Sleep 1-2 tabs    . metroNIDAZOLE (FLAGYL) 500 MG tablet Take 1 tablet (500 mg total) by mouth 3 (three) times daily. 30 tablet 0  . ondansetron (ZOFRAN) 4 MG tablet Take 1 tablet (4 mg total) by mouth every 8 (eight) hours as needed for nausea or vomiting. 20 tablet 0  . traMADol (ULTRAM) 50 MG tablet Take 1 tablet (50 mg total) by mouth every 6 (six) hours as needed. 15 tablet 0  . ondansetron (ZOFRAN ODT) 4 MG disintegrating tablet Take 1 tablet (4 mg total) by mouth every 8 (eight) hours as needed for nausea or vomiting. 20 tablet 0   No facility-administered medications prior to visit.      EXAM:  BP 104/80 (BP Location: Right Arm, Patient Position: Sitting, Cuff Size: Normal)   Pulse 86   Temp 98.1 F (36.7 C) (Oral)   Wt 196 lb 3.2 oz (89 kg)   BMI 28.97 kg/m   Body mass index is 28.97 kg/m.  GENERAL: vitals reviewed and listed above, alert, oriented, appears well hydrated and in no acute distress tired non toxic nl color  HEENT: atraumatic, conjunctiva  clear, no obvious abnormalities on inspection of external nose and ears OP : no lesion edema or exudate  NECK: no obvious masses on inspection palpation  LUNGS: clear to auscultation bilaterally, no wheezes, rales or rhonchi, good air movement CV: HRRR, no clubbing cyanosis or  peripheral edema nl cap refill  Abdomen:  Sof,t slight decrease  bowel sounds without hepatosplenomegaly, no masses   Tender suprapibic and rlq are no rebound    no CVA tenderness MS: moves all extremities without noticeable focal  abnormality PSYCH: pleasant and cooperative, no obvious depression or anxiety Lab Results  Component Value Date   WBC 8.7 06/18/2017    HGB 15.5 06/18/2017   HCT 43.1 06/18/2017   PLT 203 06/18/2017   GLUCOSE 238 (H) 06/18/2017   CHOL 191 10/27/2015   TRIG 210.0 (H) 10/27/2015   HDL 40.70 10/27/2015   LDLDIRECT 119.0 10/27/2015   ALT 22 06/18/2017   AST 20 06/18/2017  NA 135 06/18/2017   K 4.1 06/18/2017   CL 102 06/18/2017   CREATININE 1.11 06/18/2017   BUN 15 06/18/2017   CO2 24 06/18/2017   TSH 1.67 10/27/2015   HGBA1C 7.3 12/13/2016   MICROALBUR <0.7 09/11/2016   BP Readings from Last 3 Encounters:  06/20/17 104/80  06/19/17 129/87  12/13/16 126/80   Wt Readings from Last 3 Encounters:  06/20/17 196 lb 3.2 oz (89 kg)  06/18/17 190 lb (86.2 kg)  12/13/16 206 lb (93.4 kg)   Record reviewed   ASSESSMENT AND PLAN:  Discussed the following assessment and plan:  Colitis presumed infectious - Plan: Ambulatory referral to Gastroenterology, Stool culture, Giardia/cryptosporidium (EIA)  Ileitis - Plan: Ambulatory referral to Gastroenterology, Stool culture, Giardia/cryptosporidium (EIA)  Fever, unspecified fever cause - Plan: Ambulatory referral to Gastroenterology, Stool culture, Giardia/cryptosporidium (EIA)  Type 1 diabetes mellitus without complication (HCC)  RLS (restless legs syndrome) Acute illness beginning with fever chills developing watery diarrhea and right lower quadrant pain CT consistent with colitis ileitis appendix not seen. No obvious exposures. Was at beach  No one else sick no well water  Yersinia can sometimes present RLQ   As well as vibrio     In regard to his diabetes he is very knowledgeable would have him contact Dr. George Hugh office about other advice of diabetes control through this illness. Note for work. Have him contact me after this is better to make a decision about other interventions for restless leg syndrome. -Patient advised to return or notify health care team  if  new concerns arise. Or worse  Advise  Contact dr Everardo All office for  ? About  controlling dm at this  time  Encouraged to sign up for my chart. ths could be and infection      Yersinia salmonella and other bacgteria  But needs close fu and gi opinioni esp if not getting better   To continue antibiotics  And get stool tets today if can collect  Patient Instructions   Hopefully this is an acute infection that will resolve with time and antibiotics. Monitor your temperature at night or if you're getting chills.  Expect a fever to be gone in the next 2 days if it is not always going away I'm going to put a referral in to have gastroneurology see you in follow-up as we discussed.   Let us know  If not  Improving   by Friday   Am .  The antibiotics you are on  Do treat a numbner of possibe bacteria  That could cause this problem .     I will contact Dr. George Hugh office about any other advice on your diabetes control. We'll review her chart about the restless leg on first glance I don't see medication that was prescribed there are other things we can do after you this acute problem is resolved.    Ct reports  Stomach/Bowel: Stomach is nonenlarged. No dilated small bowel. Terminal ileal thickening. Mild wall thickening of the cecum, ascending colon, hepatic flexure and transverse colon. Appendix not well visualized.  Vascular/Lymphatic: Non aneurysmal aorta.Prominent right lower quadrant mesenteric lymph nodes, measuring up to 1.5 cm.  Reproductive: Prostate gland calcification.  Other: No free air or free fluid  Musculoskeletal: No acute or significant osseous findings.  IMPRESSION: 1. Terminal ileal thickening and mild wall thickening of the right colon and transverse colon consistent with ileitis and colitis which may be secondary to infection or inflammatory bowel disease. 2. Multiple  right lower quadrant mesenteric nodes are likely reactive   Electronically Signed   By: Jasmine PangKim  Fujinaga M.D.   On: 06/19/2017 02:59      Neta MendsWanda K. Marian Meneely M.D.

## 2017-06-21 ENCOUNTER — Ambulatory Visit (INDEPENDENT_AMBULATORY_CARE_PROVIDER_SITE_OTHER): Payer: 59 | Admitting: Endocrinology

## 2017-06-21 ENCOUNTER — Encounter: Payer: Self-pay | Admitting: Endocrinology

## 2017-06-21 VITALS — BP 118/72 | HR 75 | Ht 70.0 in | Wt 196.0 lb

## 2017-06-21 DIAGNOSIS — E109 Type 1 diabetes mellitus without complications: Secondary | ICD-10-CM | POA: Diagnosis not present

## 2017-06-21 LAB — POCT GLYCOSYLATED HEMOGLOBIN (HGB A1C): Hemoglobin A1C: 7.8

## 2017-06-21 NOTE — Progress Notes (Signed)
Subjective:    Patient ID: Andrew Ruiz, male    DOB: 04/10/1991, 26 y.o.   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  He has an enlite continuous glucose monitor.   Interval history:  He takes these pump settings:  basal rate of 0.7 units/hr, 24 hrs per day.   mealtime bolus of 4-6 units, depending on the size of the meal (he prefers to take a general estimate of his mealtime bolus, rather than CHO counting).  correction bolus of 1 unit for each 30 by which cbg exceeds 100.   Pt has a few days of fever (102.7) and diarrhea.  He was seen in ER, and found to have colitis.  He was rx'ed flagyl.  Fever is resolved, but diarrhea is not better.  Over the past few days, he has taken approx 60 total units per day.  He stopped pump auto mode a few days ago.  We reviewed continuous glucose monitor information together.  Glucoses are in the 200's.  There is no trend throughout the day.   Past Medical History:  Diagnosis Date  . ADHD (attention deficit hyperactivity disorder)    [facility] no ld  . Allergy   . Diabetes mellitus without complication (HCC)   . History of precordial chest pain   . Pilonidal abscess   . Pilonidal cyst with abscess 08/28/2011  . Ventricular pre-excitation    on ekg card eval  no restrictions    No past surgical history on file.  Social History   Social History  . Marital status: Single    Spouse name: N/A  . Number of children: N/A  . Years of education: N/A   Occupational History  . Not on file.   Social History Main Topics  . Smoking status: Never Smoker  . Smokeless tobacco: Never Used  . Alcohol use No  . Drug use: No  .  Sexual activity: Not on file   Other Topics Concern  . Not on file   Social History Narrative   ** Merged History Encounter **       HH of 4   Pets Cats,dogs and birdWas at [employer]. 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    Lives alone in an apartment    Current Outpatient Prescriptions on File Prior to Visit  Medication Sig Dispense Refill  . ACCU-CHEK FASTCLIX LANCETS MISC     . cetirizine (ZYRTEC) 10 MG tablet Take 10 mg by mouth daily.    . ciprofloxacin (CIPRO) 500 MG tablet Take 1 tablet (500 mg total) by mouth 2 (two) times daily. 20 tablet 0  . CONTOUR NEXT TEST test strip TEST 8 TIMES PER DAY, AND LANCETS 8/DAY 240 each 11  . Diclofenac Sodium (PENNSAID) 2 % SOLN Place 2 application onto the skin 2 (two) times daily. 112 g 3  . glucagon (GLUCAGON EMERGENCY) 1 MG injection Inject 1 mg into the vein once as needed. 1 each 12  . ibuprofen (ADVIL,MOTRIN) 600 MG tablet Take 1 tablet (600 mg total) by mouth every 6 (six)  hours as needed. 30 tablet 0  . Insulin Infusion Pump Supplies (INSULIN PUMP SYRINGE RESERVOIR) MISC 1 Device by Does not apply route every 3 (three) days. 30 each 3  . Insulin Infusion Pump Supplies (ULTRAFLEX) MISC 1 Device by Does not apply route every 3 (three) days. 80 cm tube length, 8 mm catheter length, ref # [id number] 10 each 1  . insulin lispro (HUMALOG KWIKPEN) 100 UNIT/ML KiwkPen For use in pen, for a total of 80 units per day. 30 mL 2  . insulin lispro (HUMALOG) 100 UNIT/ML injection FOR USE IN PUMP, TOTAL OF 80 UNITS PER DAY. 30 mL 11  . Melatonin 5 MG TABS Take by mouth. PRN Sleep 1-2 tabs    . metroNIDAZOLE (FLAGYL) 500 MG tablet Take 1 tablet (500 mg total) by mouth 3 (three) times daily. 30 tablet 0  . ondansetron (ZOFRAN) 4 MG tablet Take 1 tablet (4 mg total) by mouth every 8 (eight) hours as needed for nausea or vomiting. 20 tablet 0  . traMADol (ULTRAM) 50 MG tablet Take 1 tablet (50 mg  total) by mouth every 6 (six) hours as needed. 15 tablet 0   No current facility-administered medications on file prior to visit.     No Known Allergies  Family History  Problem Relation Age of Onset  . ADD / ADHD Brother   . Restless legs syndrome Unknown        grandmother on sinemet?    BP 118/72   Pulse 75   Ht 5\' 10"  (1.778 m)   Wt 196 lb (88.9 kg)   SpO2 97%   BMI 28.12 kg/m    Review of Systems He had nausea but no vomiting.     Objective:   Physical Exam VITAL SIGNS:  See vs page GENERAL: no distress.  Pulses: dorsalis pedis intact bilat.   MSK: no deformity of the feet CV: no leg edema Skin:  no ulcer on the feet.  normal color and temp on the feet.  There is a healed shallow laceration at the plantar aspect of the right foot Neuro: sensation is intact to touch on the feet.     A1c=7.8%    Assessment & Plan:  Type 1 DM: slightly worse.   Colitis, new to me.  He may be getting better.    Patient Instructions  Please come back for a follow-up appointment in 3 months.   check your blood sugar 8 times a day--before the 3 meals, and at bedtime.  also check if you have symptoms of your blood sugar being too high or too low.  please keep a record of the readings and bring it to your next appointment here.  please call us sooner if your blood sugar goes below 70, or if you have a lot of readings over 200.  For now, please take these settings: basal rate of 1 unit/hr, 24 hrs per day.  mealtime bolus of 6-8 units per meal.   correction bolus (which some people call "sensitivity," or "insulin sensitivity ratio," or just "isr") of 1 unit for each by 30 which your glucose exceeds 120.   You will feel better soon.  When that happens, it is expected that your insulin requirement will return to normal, so you should then reduce the basal back to 0.7 units/hr, and the mealtime bolus back to 4-6 units.  I hope you feel better soon.  If you don't feel better by next week,  please call Dr Fabian Sharp.  Please  call her sooner if you get worse.

## 2017-06-21 NOTE — Patient Instructions (Addendum)
Please come back for a follow-up appointment in 3 months.   check your blood sugar 8 times a day--before the 3 meals, and at bedtime.  also check if you have symptoms of your blood sugar being too high or too low.  please keep a record of the readings and bring it to your next appointment here.  please call us sooner if your blood sugar goes below 70, or if you have a lot of readings over 200.  For now, please take these settings: basal rate of 1 unit/hr, 24 hrs per day.  mealtime bolus of 6-8 units per meal.   correction bolus (which some people call "sensitivity," or "insulin sensitivity ratio," or just "isr") of 1 unit for each by 30 which your glucose exceeds 120.   You will feel better soon.  When that happens, it is expected that your insulin requirement will return to normal, so you should then reduce the basal back to 0.7 units/hr, and the mealtime bolus back to 4-6 units.  I hope you feel better soon.  If you don't feel better by next week, please call Dr Fabian SharpPanosh.  Please call her sooner if you get worse.

## 2017-07-02 MED ORDER — GABAPENTIN 300 MG PO CAPS: 300.0000 mg | ORAL_CAPSULE | Freq: Two times a day (BID) | ORAL | 1 refills | Status: DC

## 2017-07-11 ENCOUNTER — Encounter: Payer: Self-pay | Admitting: Gastroenterology

## 2017-07-12 ENCOUNTER — Ambulatory Visit (INDEPENDENT_AMBULATORY_CARE_PROVIDER_SITE_OTHER): Payer: 59 | Admitting: Gastroenterology

## 2017-07-12 ENCOUNTER — Encounter: Payer: Self-pay | Admitting: Gastroenterology

## 2017-07-12 VITALS — BP 116/72 | HR 66 | Ht 68.0 in | Wt 196.0 lb

## 2017-07-12 DIAGNOSIS — R935 Abnormal findings on diagnostic imaging of other abdominal regions, including retroperitoneum: Secondary | ICD-10-CM

## 2017-07-12 DIAGNOSIS — K529 Noninfective gastroenteritis and colitis, unspecified: Secondary | ICD-10-CM | POA: Diagnosis not present

## 2017-07-12 DIAGNOSIS — R197 Diarrhea, unspecified: Secondary | ICD-10-CM

## 2017-07-12 NOTE — Progress Notes (Signed)
Reviewed and agree with initial management plan.  Malcolm T. Stark, MD FACG 

## 2017-07-12 NOTE — Progress Notes (Signed)
07/12/2017 Andrew MageDavid A Boline III [id number] Mar 20, 1991   HISTORY OF PRESENT ILLNESS:  This is a 26 year old male who is new to our practice and referred here by Dr. Fabian SharpPanosh for evaluation regarding abdominal pain, diarrhea, and abnormal CT scan.  He states that the first of July he was at the beach and noticed that he had been feeling very gassy. Then upon return on July 8 he became feverish and shortly after he developed profuse liquidy diarrhea that lasted about 48 hours. He went to the emergency department and had a CT scan that showed terminal ileal thickening and mild wall thickening of the right colon and transverse colon consistent with ileitis and colitis which may be secondary to infectious or inflammatory bowel disease. There were also some mild multiple right lower quadrant mesenteric nodes likely reactive.  CBC, CMP, lipase were within normal limits. He was discharged home on Cipro 500 mg twice a day for 10 days and Flagyl 500 mg 3 times a day for 10 days. He completed the course of antibiotics around July 20. He says that he is feeling much better, but still has some right-sided discomfort. He is still having very loose to watery stools, but only twice a day. No nocturnal bowel movements. He denies seeing blood in his stool. He denies any bowel issues prior to this has never had any similar episodes in the past. He is concerned because he is supposed to be taking 100 mile bike ride one day next week and wants to be sure that he will be better for that.  Past Medical History:  Diagnosis Date  . ADHD (attention deficit hyperactivity disorder)    [facility] no ld  . Allergy   . Diabetes mellitus without complication (HCC)   . History of precordial chest pain   . Pilonidal abscess   . Pilonidal cyst with abscess 08/28/2011  . Ventricular pre-excitation    on ekg card eval  no restrictions   No past surgical history on file.  reports that he has never smoked. He has never  used smokeless tobacco. He reports that he does not drink alcohol or use drugs. family history includes ADD / ADHD in his brother; Restless legs syndrome in his unknown relative. No Known Allergies    Outpatient Encounter Prescriptions as of 07/12/2017  Medication Sig  . ACCU-CHEK FASTCLIX LANCETS MISC   . cetirizine (ZYRTEC) 10 MG tablet Take 10 mg by mouth daily.  . ciprofloxacin (CIPRO) 500 MG tablet Take 1 tablet (500 mg total) by mouth 2 (two) times daily.  . CONTOUR NEXT TEST test strip TEST 8 TIMES PER DAY, AND LANCETS 8/DAY  . Diclofenac Sodium (PENNSAID) 2 % SOLN Place 2 application onto the skin 2 (two) times daily.  [name]. gabapentin (NEURONTIN) 300 MG capsule Take 1 capsule (300 mg total) by mouth 2 (two) times daily. As directed for RLS (Patient taking differently: Take 300 mg by mouth at bedtime. As directed for RLS)  . glucagon (GLUCAGON EMERGENCY) 1 MG injection Inject 1 mg into the vein once as needed.  [name]. ibuprofen (ADVIL,MOTRIN) 600 MG tablet Take 1 tablet (600 mg total) by mouth every 6 (six) hours as needed.  . Insulin Infusion Pump Supplies (INSULIN PUMP SYRINGE RESERVOIR) MISC 1 Device by Does not apply route every 3 (three) days.  . Insulin Infusion Pump Supplies (ULTRAFLEX) MISC 1 Device by Does not apply route every 3 (three) days. 80 cm tube length, 8  mm catheter length, ref # [id number]  . insulin lispro (HUMALOG KWIKPEN) 100 UNIT/ML KiwkPen For use in pen, for a total of 80 units per day.  . insulin lispro (HUMALOG) 100 UNIT/ML injection FOR USE IN PUMP, TOTAL OF 80 UNITS PER DAY.  . Melatonin 5 MG TABS Take by mouth. PRN Sleep 1-2 tabs  . metroNIDAZOLE (FLAGYL) 500 MG tablet Take 1 tablet (500 mg total) by mouth 3 (three) times daily.  . ondansetron (ZOFRAN) 4 MG tablet Take 1 tablet (4 mg total) by mouth every 8 (eight) hours as needed for nausea or vomiting.  . traMADol (ULTRAM) 50 MG tablet Take 1 tablet (50 mg total) by mouth every 6 (six) hours as needed.   No  facility-administered encounter medications on file as of 07/12/2017.      REVIEW OF SYSTEMS  : All other systems reviewed and negative except where noted in the History of Present Illness.   PHYSICAL EXAM: BP 116/72   Pulse 66   Ht 5\' 8"  (1.727 m)   Wt 196 lb (88.9 kg)   BMI 29.80 kg/m  General: Well developed white male in no acute distress Head: Normocephalic and atraumatic Eyes:  Sclerae anicteric, conjunctiva pink. Ears: Normal auditory acuity Lungs: Clear throughout to auscultation; no increased WOB. Heart: Regular rate and rhythm; no M/RG. Abdomen: Soft, non-distended.  BS present.  Mild right sided TTP. Musculoskeletal: Symmetrical with no gross deformities  Skin: No lesions on visible extremities Extremities: No edema  Neurological: Alert oriented x 4, grossly non-focal Psychological:  Alert and cooperative. Normal mood and affect  ASSESSMENT AND PLAN: -26 year old male who had sudden onset abdominal pain and diarrhea with fever, CT scan showed right sided colitis and ileitis.  Seems likely to be infectious with sudden onset of symptoms.  Completed 10 days of cipro and flagyl.  Gradually improving.  Still has some right sided discomfort and about 2 loose/watery stools daily.  I am going to have him begin a daily probiotic such as VSL #3 or Florastor.  Can use Imodium prn.  I am going to have him call back in about 3 weeks to give us an update on his symptoms.  I expect that he will continue to have gradual improve and resolution of his complaints, but if no further improvement in 3 weeks then may need to consider colonoscopy to evaluate and rule out Crohn's/IBD.   CC:  Panosh, Neta MendsWanda K, MD

## 2017-07-12 NOTE — Patient Instructions (Signed)
We have given you samples of the following medication to take: VSL #3 once daily   Please purchase the following medications over the counter and take as directed: Imodium  Florastor   Please call the office with an update of your symptoms in the next one week and ask for Galvin ProfferPatty Taylor, Charity fundraiserN.

## 2017-07-16 DIAGNOSIS — E109 Type 1 diabetes mellitus without complications: Secondary | ICD-10-CM | POA: Diagnosis not present

## 2017-07-30 ENCOUNTER — Encounter: Admit: 2017-07-30 | Discharge: 2017-07-30

## 2017-07-30 ENCOUNTER — Ambulatory Visit: Admit: 2017-07-30 | Discharge: 2017-07-31

## 2017-07-30 DIAGNOSIS — Z8489 Family history of other specified conditions: Principal | ICD-10-CM

## 2017-07-30 DIAGNOSIS — Z0000 Encounter for general adult medical examination without abnormal findings: Principal | ICD-10-CM

## 2017-07-30 DIAGNOSIS — K644 Residual hemorrhoidal skin tags: ICD-10-CM

## 2017-07-30 MED ORDER — HYDROCORTISONE 2.5 % TP CRPE
Freq: Two times a day (BID) | TOPICAL | 0 refills | 50.00000 days | Status: AC
Start: 2017-07-30 — End: ?

## 2017-07-30 NOTE — Progress Notes
Date of Service: 07/30/2017    George Mcclain is a 26 y.o. male.  DOB: 1991-05-13  MRN: [id number]     Subjective:             History of Present Illness  Patient is here due for a physical, however he states he does not want to have any blood drawn because he is between insurances at this time.  Patient states he had been getting a hemorrhoid last month but he states he put medication on it and is gotten better.  He has not had any bleeding with it.  He did have some pain with it.  He states he is trying to increase fiber in his diet to keep from getting constipated.       Review of Systems   Constitutional: Negative.    HENT: Negative.    Eyes: Negative.    Respiratory: Negative.    Cardiovascular: Negative.    Gastrointestinal: Negative.         Hemorrhoids   Endocrine: Negative.    Genitourinary: Negative.    Musculoskeletal: Negative.    Skin: Negative.    Allergic/Immunologic: Negative.    Neurological: Negative.    Hematological: Negative.    Psychiatric/Behavioral: Negative.          Objective:         ??? hydrocortisone 2.5% (ANUSOL-HC) 2.5 % rectal cream Apply topically twice daily as directed     Vitals:    07/30/17 1339   BP: 126/84   Pulse: 85   Resp: 16   Temp: 36.7 ???C (98 ???F)   TempSrc: Oral   SpO2: 99%   Weight: 73.5 kg (162 lb)   Height: 172.7 cm (68)     Body mass index is 24.63 kg/m???.     Physical Exam   Constitutional: He is oriented to person, place, and time. He appears well-developed and well-nourished.   HENT:   Head: Normocephalic.   Right Ear: External ear normal.   Left Ear: External ear normal.   Mouth/Throat: Oropharynx is clear and moist.   Eyes: EOM are normal. Pupils are equal, round, and reactive to light.   Neck: Normal range of motion. Neck supple.   Cardiovascular: Normal rate, regular rhythm and normal heart sounds.    Pulmonary/Chest: Effort normal and breath sounds normal.   Abdominal: Soft. Bowel sounds are normal.   Genitourinary: Genitourinary Comments: Genitalia ok, has a small external hemorrhoid non thrombotic   Musculoskeletal: Normal range of motion.   Neurological: He is alert and oriented to person, place, and time. He has normal reflexes.   Skin: Skin is warm and dry.   Psychiatric: He has a normal mood and affect.            Assessment and Plan:  George Mcclain was seen today for physical and hemorrhoids.    Diagnoses and all orders for this visit:    PE (physical exam), annual    External hemorrhoid  -     hydrocortisone 2.5% (ANUSOL-HC) 2.5 % rectal cream; Apply topically twice daily as directed    I reviewed with the patient their current medications and specifically any new medications prescribed at the time of this visit and we reviewed the expected benefits and potential side effects. All questions are answered to the patient's satisfaction.

## 2017-08-14 ENCOUNTER — Encounter: Payer: Self-pay | Admitting: Internal Medicine

## 2017-08-14 NOTE — Telephone Encounter (Signed)
Dr. Panosh - Please advise. Thanks! 

## 2017-08-16 ENCOUNTER — Telehealth: Payer: Self-pay | Admitting: Internal Medicine

## 2017-08-16 NOTE — Telephone Encounter (Addendum)
Pt received a mychart message from Dr Fabian Sharppanosh concerning a new rx for  Requip.  [place] is in [place] on vacation and would like us to sen it there.   Advised Dr Fabian SharpPanosh is out this afternoon, and he would like to know if someone could call that in ASAP today [facility]:   [facility]  [address] [place], [place] [id number]  Phone [phone]  (see mychart message)

## 2017-08-16 NOTE — Telephone Encounter (Signed)
Dr. Fabian SharpPanosh - Please advise on strength of Requip. Thanks!

## 2017-08-16 NOTE — Telephone Encounter (Signed)
Pt advised that we are waiting on Dr Fabian SharpPanosh to advise dose of medication.  RX will need to be sent to the pharmacy in this message.   FYI

## 2017-08-17 MED ORDER — ROPINIROLE HCL 0.25 MG PO TABS: ORAL_TABLET | ORAL | 1 refills | Status: DC

## 2017-08-17 NOTE — Telephone Encounter (Signed)
Pt is calling back want to make sure that the Rx will be filled today.  Pt would like to have a call back today.

## 2017-08-17 NOTE — Telephone Encounter (Signed)
Pt is calling to check the status of the Rx that he has requested and want to see if it can be called in today so that he can pick it up after lunch.  Pharm is in the [facility](mychart msg).

## 2017-08-20 NOTE — Telephone Encounter (Signed)
Medication has been sent to patient pharmacy.

## 2017-08-22 ENCOUNTER — Encounter: Admit: 2017-08-22 | Discharge: 2017-08-22

## 2017-08-22 NOTE — Telephone Encounter
Pt was here 8/20. Received paperwork for time off work. Per pt, he missed work, starting in July. We did not see him then. Let pt know he may need to see about this paperwork. If we do it, he needs to sign the release. Pt had 2 ER visits in July ... Need to get records, if he has to have forms filled out

## 2017-08-31 ENCOUNTER — Encounter: Payer: Self-pay | Admitting: Internal Medicine

## 2017-09-12 ENCOUNTER — Telehealth: Payer: Self-pay | Admitting: Endocrinology

## 2017-09-12 ENCOUNTER — Other Ambulatory Visit: Payer: Self-pay

## 2017-09-12 MED ORDER — INSULIN GLARGINE 100 UNIT/ML SOLOSTAR PEN: 20.0000 [IU] | PEN_INJECTOR | Freq: Every day | SUBCUTANEOUS | 99 refills | Status: DC

## 2017-09-12 NOTE — Telephone Encounter (Signed)
Ok, I have sent a prescription to your pharmacy 

## 2017-09-12 NOTE — Telephone Encounter (Signed)
MEDICATION: Lantus pens long acting insulin  PHARMACY:   [id number] [place], [place] - [address] [phone] (Phone) [phone] (Fax)     IS THIS A 90 DAY SUPPLY : yes  IS PATIENT OUT OF MEDICATION: yes  IF NOT; HOW MUCH IS LEFT:   LAST APPOINTMENT DATE: /11/2017  NEXT APPOINTMENT DATE:pt to call back to reschedule 10/12 appointment OTHER COMMENTS:    **Let patient know to contact pharmacy at the end of the day to make sure medication is ready. **  ** Please notify patient to allow 48-72 hours to process**  **Encourage patient to contact the pharmacy for refills or they can request refills through [facility]**

## 2017-09-12 NOTE — Telephone Encounter (Signed)
Called patient to make sure this was correct insulin but had to leave VM. He appears to have an insulin pump & lantus is no longer under his current medication list.

## 2017-09-12 NOTE — Telephone Encounter (Signed)
Patient stated that he is wearing pump but is having rouble with medtronic & likes to have back up. He is out.

## 2017-09-12 NOTE — Telephone Encounter (Signed)
Caller Name: patient  Relationship to Patient:  Best Number: [phone]  [facility]  Reason for call: Pt returned call, Yes he does want the lantus. Pt is out of medication

## 2017-09-12 NOTE — Telephone Encounter (Signed)
please call patient: Is the lantus for use in case of a pump malfunction?  If not, are you taking it in addition to pump use.

## 2017-09-13 NOTE — Telephone Encounter (Signed)
[facility] called back stating [facility] would like Dr. Everardo All to suggest mediation for patient. [facility] stated [facility] does not suggest anything other than the Lantus. Please call patient and advise.

## 2017-09-13 NOTE — Telephone Encounter (Signed)
Called patient and he stated that when prescription was sent in for Lantus his insurance no longer covers it. He is going to call pharmacy & call back to let us know what alternative they cover. Patient stated that he believes that it is Levemir.

## 2017-09-18 ENCOUNTER — Other Ambulatory Visit: Payer: Self-pay

## 2017-09-18 ENCOUNTER — Telehealth: Payer: Self-pay | Admitting: Endocrinology

## 2017-09-18 ENCOUNTER — Other Ambulatory Visit: Payer: Self-pay | Admitting: Internal Medicine

## 2017-09-18 MED ORDER — BASAGLAR KWIKPEN 100 UNIT/ML ~~LOC~~ SOPN: PEN_INJECTOR | SUBCUTANEOUS | 5 refills | Status: DC

## 2017-09-18 NOTE — Telephone Encounter (Signed)
Notified patient that I sent in a prescription for Basaglar.

## 2017-09-18 NOTE — Telephone Encounter (Signed)
Patient stated that this prescription should be  0.5mg  instead of  0.25 mg.

## 2017-09-18 NOTE — Telephone Encounter (Signed)
[facility] isnt covering lantus anymore no alternate offered  Possibly basaglar

## 2017-09-18 NOTE — Telephone Encounter (Signed)
Patient needs the below medication refilled. States that the pharmacy has sent over the request multiple times but have not heard back.   rOPINIRole (REQUIP) 0.25 MG tablet

## 2017-09-19 ENCOUNTER — Telehealth: Payer: Self-pay

## 2017-09-19 ENCOUNTER — Other Ambulatory Visit: Payer: Self-pay

## 2017-09-19 ENCOUNTER — Encounter: Payer: Self-pay | Admitting: Internal Medicine

## 2017-09-19 NOTE — Telephone Encounter (Signed)
Would you like for rx dosage to be changed and resent to patient pharmacy?

## 2017-09-19 NOTE — Telephone Encounter (Addendum)
Ok to change to basaglar, same dosage

## 2017-09-19 NOTE — Telephone Encounter (Signed)
I never received a request for refill. And if  Changing dose may have been sent by paper?? We started him on 0.25 mg last month    Please discuss/ document  with patient how he is doing on the 0.5 mg. And is it helpful ?  May fill 0.5 mg disp 30  Refill x 1

## 2017-09-19 NOTE — Telephone Encounter (Signed)
lantus is not covered under the patients insurance should a PA be done or change in medication please advise

## 2017-09-19 NOTE — Telephone Encounter (Signed)
I sent in a prescription for Basaglar yesterday.

## 2017-09-20 MED ORDER — ROPINIROLE HCL 0.5 MG PO TABS: ORAL_TABLET | ORAL | 1 refills | Status: DC

## 2017-09-20 NOTE — Telephone Encounter (Addendum)
This has been handled in telephone encounter. Nothing further needed.

## 2017-09-20 NOTE — Telephone Encounter (Signed)
Pt has been taking .  of requip at bedtime and was given the option to increase to .  nightly. Pt has increased to the .  nightly dose and likes this but is wanting an Rx for the .  tablets so that he only has to take 1 tablet every night rather than two of the .  tablets.  Per notes below WP okay with filling the .  tablets #30, x 1 refill.   [facility]. Nothing further needed.

## 2017-09-21 ENCOUNTER — Ambulatory Visit: Payer: Self-pay | Admitting: Endocrinology

## 2017-10-01 ENCOUNTER — Encounter: Admit: 2017-10-01 | Discharge: 2017-10-01

## 2017-10-01 ENCOUNTER — Ambulatory Visit: Admit: 2017-10-01 | Discharge: 2017-10-02

## 2017-10-01 DIAGNOSIS — G479 Sleep disorder, unspecified: Principal | ICD-10-CM

## 2017-10-01 DIAGNOSIS — Z8489 Family history of other specified conditions: Principal | ICD-10-CM

## 2017-10-01 NOTE — Progress Notes
Date of Service: 10/01/2017    George Mcclain is a 26 y.o. male.  DOB: September 22, 1991  MRN: [id number]     Subjective:             History of Present Illness  This patient is here today for follow-up from his last visit.  The patient states he is doing well, the only complaint he has is that he has been having trouble falling asleep for the past month or 2.  We discussed different types of modalities for this, he states he has tried melatonin but it really did not help much.  He states he does stay up a lot and draws.  I suggested that we possibly get a sleep study on him, however he does not have any insurance yet but he states he thinks he will in the next few weeks.  He states he will call me when he is able to get the lab tests that he needs and also for a sleep study.  We also discussed different strategies to quit smoking.       Review of Systems   Constitutional: Negative.    HENT: Negative.    Eyes: Negative.    Respiratory: Negative.    Cardiovascular: Negative.    Gastrointestinal: Negative.    Endocrine: Negative.    Genitourinary: Negative.    Musculoskeletal: Negative.    Skin: Negative.    Allergic/Immunologic: Negative.    Neurological: Negative.    Hematological: Negative.    Psychiatric/Behavioral: Negative.          Objective:         ??? hydrocortisone 2.5% (ANUSOL-HC) 2.5 % rectal cream Apply topically twice daily as directed     Vitals:    10/01/17 0844   BP: 132/74   Pulse: 78   Resp: 14   Temp: 36.7 ???C (98 ???F)   TempSrc: Oral   SpO2: 98%   Weight: 78 kg (172 lb)   Height: 172.7 cm (68)     Body mass index is 26.15 kg/m???.     Physical Exam   Constitutional: He is oriented to person, place, and time. He appears well-developed and well-nourished.   HENT:   Head: Normocephalic.   Right Ear: External ear normal.   Left Ear: External ear normal.   Mouth/Throat: Oropharynx is clear and moist.   Eyes: Pupils are equal, round, and reactive to light. EOM are normal.   Neck: Normal range of motion. Neck supple. Cardiovascular: Normal rate, regular rhythm and normal heart sounds.    Pulmonary/Chest: Effort normal and breath sounds normal.   Abdominal: Soft. Bowel sounds are normal.   Musculoskeletal: Normal range of motion.   Neurological: He is alert and oriented to person, place, and time. He has normal reflexes.   Skin: Skin is warm and dry.   Psychiatric: He has a normal mood and affect.            Assessment and Plan:  Smoking Cessation Management & Counseling:  Social History   Substance Use Topics   ??? Smoking status: Current Every Day Smoker     Packs/day: 0.10     Types: Cigars   ??? Smokeless tobacco: Never Used   ??? Alcohol use No     During today's visit, I counseled the patient on smoking cessation.  This was through face-to-face patient contact.  We discussed how tobacco use can adversely affect the patient's medical condition. We also discussed strategies to quit smoking including the use of  medications and counseling.  The patient is planning to try to quit or cut down on smoking.   Impression: Tobacco use (smoking)  Plan:  The following are brief descriptors of advice discussed at today's visit:   Practical Counseling Advice (problem -solving/skills training)  ??? Make the house smoke free                  ??? Identify alternatives when faced with the urge to smoke       .     Identified reasons to quit   Recommended patient consider speaking to a cessation counselor over the telephone or signing up for a text-message cessation program. The patient chose:free smoking cessation quitline (1-800-QUIT-NOW 857-196-8584).  Discussed medication options (covered by most insurance plans). The patient chose:No medications at this time.  Time spent during clinic visit with patient discussing and counseling regarding smoking cessation is:3-10 minutes.      Smoking Cessation Patient Educational resources:   Dear patient,   As your healthcare team, we want you to take steps to cut down or quit smoking. Please review the following educational materials and resources:  Resources for Patients to Quit Smoking  If you live outside of [place] or [place] Quit line: 1-800-QUIT-NOW 416-636-3283) This line will route you to your state sponsored quit line based on your calling area.  The website for the national quit line is: www.smokefree.gov  For [place] residents:   [place] Dept [employer] KanQuit program  620-509-3294 or 740-687-9665)  ConnectRV.is.html  Free program 24hrs per day 7 days per week. English, [place], & 150 other languages.  [place] Quit line: 1-866-KAN-STOP (3-244-010-2725)  http://www.kstask.org/quitline.html  Free program available 24 hrs per day 7 days per week. Information is available via phone or online. Phone assessment by trained counselor to help identify triggers and set goals, smoker can call as many times as desired.  For [place] residents  [place] quit line website:  ThisMLS.nl  link directly to the [place] quit line patient brochure:  TVFolio.ch.pdf  ==================================================================       George Mcclain was seen today for sleep problem.    Diagnoses and all orders for this visit:    Sleep disorder    I reviewed with the patient their current medications and specifically any new medications prescribed at the time of this visit and we reviewed the expected benefits and potential side effects. All questions are answered to the patient's satisfaction.

## 2017-10-16 ENCOUNTER — Other Ambulatory Visit: Payer: Self-pay | Admitting: Internal Medicine

## 2017-10-16 DIAGNOSIS — Z23 Encounter for immunization: Secondary | ICD-10-CM | POA: Diagnosis not present

## 2017-10-20 ENCOUNTER — Other Ambulatory Visit: Payer: Self-pay | Admitting: Internal Medicine

## 2017-10-23 NOTE — Telephone Encounter (Signed)
Please contact  Pt  Phone or my chart about how he is doing on this medication and if  Doing better   can refill   60 days

## 2017-10-23 NOTE — Telephone Encounter (Signed)
Refill request for Medication: Requip 0.5mg  Last Filled: 09/20/17, #30 x 1 RF Previous / Upcoming Appt: previous 06/20/17 OV No upcoming OV scheduled.   Please advise Dr Fabian SharpPanosh, thanks.

## 2017-10-24 NOTE — Telephone Encounter (Signed)
This is actually an old refill request  Dose was increased to 0.5mg  tablets 09/19/17 x 1 RF Pt is not due for another refill until December Nothing further needed.

## 2017-10-29 ENCOUNTER — Encounter: Payer: Self-pay | Admitting: Endocrinology

## 2017-10-29 ENCOUNTER — Ambulatory Visit: Payer: 59 | Admitting: Endocrinology

## 2017-10-29 VITALS — BP 122/86 | HR 90 | Wt 205.6 lb

## 2017-10-29 DIAGNOSIS — E109 Type 1 diabetes mellitus without complications: Secondary | ICD-10-CM | POA: Diagnosis not present

## 2017-10-29 LAB — POCT GLYCOSYLATED HEMOGLOBIN (HGB A1C): Hemoglobin A1C: 7.9

## 2017-10-29 NOTE — Patient Instructions (Addendum)
Please come back for a follow-up appointment in 3 months.   check your blood sugar 8 times a day--before the 3 meals, and at bedtime.  also check if you have symptoms of your blood sugar being too high or too low.  please keep a record of the readings and bring it to your next appointment here.  please call us sooner if your blood sugar goes below 70, or if you have a lot of readings over 200.  For now, please take these settings: basal rate of 1 unit/hr, 24 hrs per day.  mealtime bolus of 1 unit/10 grams carbohydrate, at all meals and snacks.   correction bolus (which some people call "sensitivity," or "insulin sensitivity ratio," or just "isr") of 1 unit for each by 30 which your glucose exceeds 120.   Please come back for a follow-up appointment in 2 months.

## 2017-10-29 NOTE — Progress Notes (Signed)
Subjective:    Patient ID: Andrew Ruiz, male    DOB: May 16, 1991, 26 y.o.   MRN: [id number]  HPI Pt returns for f/u of diabetes mellitus: DM type: 1 Dx'ed: 2014.  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   Interval history:  He takes these pump settings:  basal rate of 1 unit/hr, 24 hrs per day.   mealtime bolus of 1 unit per 06-18-09 grams of CHO, (B-L-D) units.   correction bolus of 1 unit for each 30 by which cbg exceeds 100.    Over the past few days, he has taken approx 60 total units per day.  He stopped pump auto mode a few days ago.  He seldom has hypoglycemia, and these episodes are mild.  We downloaded continuous glucose monitor and reviewed together.  It varies from 80-250.  There is little pattern throughout the day, except that there is an upward trend from 8 AM-9 AM.   Past Medical History:  Diagnosis Date  . ADHD (attention deficit hyperactivity disorder)    eval [facility] no ld  . Allergy   . Diabetes mellitus without complication (HCC)   . History of precordial chest pain   . Pilonidal abscess   . Pilonidal cyst with abscess 08/28/2011  . Ventricular pre-excitation    on ekg card eval  no restrictions    History reviewed. No pertinent surgical history.  Social History   Socioeconomic History  . Marital status: Single    Spouse name: Not on file  . Number of children: Not on file  . Years of education: Not on file  . Highest education level: Not on file  Social Needs  . Financial resource strain: Not on file  . Food insecurity - worry: Not on file  . Food insecurity - inability: Not on file  . Transportation needs - medical: Not on file  . Transportation needs - non-medical: Not on file  Occupational  History  . Not on file  Tobacco Use  . Smoking status: Never Smoker  . Smokeless tobacco: Never Used  Substance and Sexual Activity  . Alcohol use: No  . Drug use: No  . Sexual activity: Not on file  Other Topics Concern  . Not on file  Social History Narrative   ** Merged History Encounter **       HH of 4   Pets Cats,dogs and birdWas at [employer]. 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    Lives alone in an apartment    Current Outpatient Medications on File Prior to Visit  Medication Sig Dispense Refill  . ACCU-CHEK FASTCLIX LANCETS MISC     . cetirizine (ZYRTEC) 10 MG tablet Take 10 mg by mouth daily.    . ciprofloxacin (CIPRO) 500 MG tablet Take 1 tablet (500 mg total) by mouth 2 (two) times daily. 20 tablet 0  . CONTOUR NEXT TEST test strip TEST 8 TIMES PER DAY, AND LANCETS 8/DAY 240 each 11  . Diclofenac Sodium (PENNSAID) 2 % SOLN Place 2 application onto the skin 2 (two) times daily. 112 g 3  . gabapentin (NEURONTIN) 300 MG capsule Take 1  capsule (300 mg total) by mouth 2 (two) times daily. As directed for RLS (Patient taking differently: Take 300 mg by mouth at bedtime. As directed for RLS) 60 capsule 1  . glucagon (GLUCAGON EMERGENCY) 1 MG injection Inject 1 mg into the vein once as needed. 1 each 12  . ibuprofen (ADVIL,MOTRIN) 600 MG tablet Take 1 tablet (600 mg total) by mouth every 6 (six) hours as needed. 30 tablet 0  . Insulin Glargine (BASAGLAR KWIKPEN) 100 UNIT/ML SOPN Inject 20 units into the skin daily. 5 pen 5  . Insulin Infusion Pump Supplies (INSULIN PUMP SYRINGE RESERVOIR) MISC 1 Device by Does not apply route every 3 (three) days. 30 each 3  . Insulin Infusion Pump Supplies (ULTRAFLEX) MISC 1 Device by Does not apply route every 3 (three) days. 80 cm tube length, 8 mm catheter length, ref # [id number] 10 each 1  . insulin lispro (HUMALOG KWIKPEN) 100 UNIT/ML KiwkPen For use in pen, for a total of 80 units per  day. 30 mL 2  . insulin lispro (HUMALOG) 100 UNIT/ML injection FOR USE IN PUMP, TOTAL OF 80 UNITS PER DAY. 30 mL 11  . Melatonin 5 MG TABS Take by mouth. PRN Sleep 1-2 tabs    . metroNIDAZOLE (FLAGYL) 500 MG tablet Take 1 tablet (500 mg total) by mouth 3 (three) times daily. 30 tablet 0  . ondansetron (ZOFRAN) 4 MG tablet Take 1 tablet (4 mg total) by mouth every 8 (eight) hours as needed for nausea or vomiting. 20 tablet 0  . rOPINIRole (REQUIP) 0.25 MG tablet 0.25 mg 1-3 hours before bed . Can increase to 0.5 mg after 2-4 days 60 tablet 1  . rOPINIRole (REQUIP) 0.5 MG tablet Take 1 tablet 1-3 hours before bed. 30 tablet 1  . traMADol (ULTRAM) 50 MG tablet Take 1 tablet (50 mg total) by mouth every 6 (six) hours as needed. 15 tablet 0   No current facility-administered medications on file prior to visit.     No Known Allergies  Family History  Problem Relation Age of Onset  . ADD / ADHD Brother   . Restless legs syndrome Unknown        grandmother on sinemet?    BP 122/86 (BP Location: Left Arm, Patient Position: Sitting, Cuff Size: Normal)   Pulse 90   Wt 205 lb 9.6 oz (93.3 kg)   SpO2 95%   BMI 31.26 kg/m   Review of Systems Denies LOC.  He has gained weight.      Objective:   Physical Exam VITAL SIGNS:  See vs page GENERAL: no distress Pulses: foot pulses are intact bilaterally.   MSK: no deformity of the feet or ankles.  CV: no edema of the legs or ankles Skin:  no ulcer on the feet or ankles.  normal color and temp on the feet and ankles Neuro: sensation is intact to touch on the feet and ankles.     Lab Results  Component Value Date   HGBA1C 7.9 10/29/2017      Assessment & Plan:  Type 1 DM: he needs increased rx, if it can be done with a regimen that avoids or minimizes hypoglycemia.   Patient Instructions  Please come back for a follow-up appointment in 3 months.   check your blood sugar 8 times a day--before the 3 meals, and at bedtime.  also check if  you have symptoms of your blood sugar being too high or too low.  please keep a record  of the readings and bring it to your next appointment here.  please call us sooner if your blood sugar goes below 70, or if you have a lot of readings over 200.  For now, please take these settings: basal rate of 1 unit/hr, 24 hrs per day.  mealtime bolus of 1 unit/10 grams carbohydrate, at all meals and snacks.   correction bolus (which some people call "sensitivity," or "insulin sensitivity ratio," or just "isr") of 1 unit for each by 30 which your glucose exceeds 120.   Please come back for a follow-up appointment in 2 months.

## 2017-10-30 DIAGNOSIS — E109 Type 1 diabetes mellitus without complications: Secondary | ICD-10-CM | POA: Diagnosis not present

## 2017-11-05 DIAGNOSIS — E109 Type 1 diabetes mellitus without complications: Secondary | ICD-10-CM | POA: Diagnosis not present

## 2017-11-06 ENCOUNTER — Encounter: Payer: Self-pay | Admitting: Endocrinology

## 2017-11-10 ENCOUNTER — Encounter: Payer: Self-pay | Admitting: Endocrinology

## 2017-12-12 ENCOUNTER — Other Ambulatory Visit: Payer: Self-pay | Admitting: Internal Medicine

## 2017-12-27 ENCOUNTER — Ambulatory Visit: Payer: 59 | Admitting: Endocrinology

## 2017-12-27 ENCOUNTER — Encounter: Payer: Self-pay | Admitting: Endocrinology

## 2017-12-27 VITALS — BP 132/80 | HR 77 | Resp 20 | Wt 212.0 lb

## 2017-12-27 DIAGNOSIS — E109 Type 1 diabetes mellitus without complications: Secondary | ICD-10-CM | POA: Diagnosis not present

## 2017-12-27 LAB — POCT GLYCOSYLATED HEMOGLOBIN (HGB A1C): HEMOGLOBIN A1C: 7.5

## 2017-12-27 NOTE — Patient Instructions (Addendum)
Please come back for a follow-up appointment in 3 months.   check your blood sugar 8 times a day--before the 3 meals, and at bedtime.  also check if you have symptoms of your blood sugar being too high or too low.  please keep a record of the readings and bring it to your next appointment here.  please call us sooner if your blood sugar goes below 70, or if you have a lot of readings over 200.  For now, please take these settings: basal rate of 1 unit/hr, 24 hrs per day.  mealtime bolus of 1 unit/9 grams carbohydrate, at all meals and snacks.   correction bolus (which some people call "sensitivity," or "insulin sensitivity ratio," or just "isr") of 1 unit for each by 30 which your glucose exceeds 120.   Please come back for a follow-up appointment in 2 months.

## 2017-12-27 NOTE — Progress Notes (Signed)
Subjective:    Patient ID: Andrew Ruiz, male    DOB: 1991-11-14, 27 y.o.   MRN: [id number]  HPI Pt returns for f/u of diabetes mellitus: DM type: 1 Dx'ed: 2014.  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   Interval history:  He takes these pump settings:  basal rate of 1 unit/hr, 24 hrs per day.   mealtime bolus of 1 unit per 10 grams of CHO.   correction bolus of 1 unit for each 30 by which cbg exceeds 100.    Over the past few days, he has taken approx 100 total units per day. He has recently been working very few night shifts.  Pump is downloaded, and we reviewed continuous glucose monitor data together.  It varies from 90-200's.  There is no trend throughout the day. Past Medical History:  Diagnosis Date  . ADHD (attention deficit hyperactivity disorder)    [facility]  . Allergy   . Diabetes mellitus without complication (HCC)   . History of precordial chest pain   . Pilonidal abscess   . Pilonidal cyst with abscess 08/28/2011  . Ventricular pre-excitation    on ekg card eval  no restrictions    History reviewed. No pertinent surgical history.  Social History   Socioeconomic History  . Marital status: Single    Spouse name: Not on file  . Number of children: Not on file  . Years of education: Not on file  . Highest education level: Not on file  Social Needs  . Financial resource strain: Not on file  . Food insecurity - worry: Not on file  . Food insecurity - inability: Not on file  . Transportation needs - medical: Not on file  . Transportation needs - non-medical: Not on file  Occupational History  . Not on file  Tobacco Use  . Smoking status: Never Smoker  . Smokeless tobacco: Never Used    Substance and Sexual Activity  . Alcohol use: No  . Drug use: No  . Sexual activity: Not on file  Other Topics Concern  . Not on file  Social History Narrative   ** Merged History Encounter **       HH of 4   Pets Cats,dogs and birdWas at [employer]. 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    Lives alone in an apartment    Current Outpatient Medications on File Prior to Visit  Medication Sig Dispense Refill  . ACCU-CHEK FASTCLIX LANCETS MISC     . cetirizine (ZYRTEC) 10 MG tablet Take 10 mg by mouth daily.    . CONTOUR NEXT TEST test strip TEST 8 TIMES PER DAY, AND LANCETS 8/DAY 240 each 11  . glucagon (GLUCAGON EMERGENCY) 1 MG injection Inject 1 mg into the vein once as needed. 1 each 12  . ibuprofen (ADVIL,MOTRIN) 600 MG tablet Take 1 tablet (600 mg total) by mouth every 6 (six) hours as needed. 30 tablet 0  . Insulin Glargine (BASAGLAR KWIKPEN) 100 UNIT/ML SOPN Inject 20 units into the skin daily. 5 pen 5  . Insulin Infusion Pump Supplies (INSULIN PUMP SYRINGE RESERVOIR) MISC  1 Device by Does not apply route every 3 (three) days. 30 each 3  . Insulin Infusion Pump Supplies (ULTRAFLEX) MISC 1 Device by Does not apply route every 3 (three) days. 80 cm tube length, 8 mm catheter length, ref # [id number] 10 each 1  . insulin lispro (HUMALOG KWIKPEN) 100 UNIT/ML KiwkPen For use in pen, for a total of 80 units per day. 30 mL 2  . insulin lispro (HUMALOG) 100 UNIT/ML injection FOR USE IN PUMP, TOTAL OF 80 UNITS PER DAY. 30 mL 11  . Melatonin 5 MG TABS Take by mouth. PRN Sleep 1-2 tabs    . metroNIDAZOLE (FLAGYL) 500 MG tablet Take 1 tablet (500 mg total) by mouth 3 (three) times daily. 30 tablet 0  . ondansetron (ZOFRAN) 4 MG tablet Take 1 tablet (4 mg total) by mouth every 8 (eight) hours as needed for nausea or vomiting. 20 tablet 0  . rOPINIRole (REQUIP) 0.5 MG tablet TAKE 1 TABLET 1-3 HOURS BEFORE BED. 30 tablet 1  . traMADol  (ULTRAM) 50 MG tablet Take 1 tablet (50 mg total) by mouth every 6 (six) hours as needed. 15 tablet 0  . Diclofenac Sodium (PENNSAID) 2 % SOLN Place 2 application onto the skin 2 (two) times daily. (Patient not taking: Reported on 12/27/2017) 112 g 3  . gabapentin (NEURONTIN) 300 MG capsule Take 1 capsule (300 mg total) by mouth 2 (two) times daily. As directed for RLS (Patient not taking: Reported on 12/27/2017) 60 capsule 1   No current facility-administered medications on file prior to visit.     No Known Allergies  Family History  Problem Relation Age of Onset  . ADD / ADHD Brother   . Restless legs syndrome Unknown        grandmother on sinemet?    BP 132/80 (BP Location: Right Arm, Patient Position: Sitting, Cuff Size: Large)   Pulse 77   Resp 20   Wt 212 lb (96.2 kg)   SpO2 98%   BMI 32.23 kg/m   Review of Systems He denies hypoglycemia    Objective:   Physical Exam VITAL SIGNS:  See vs page GENERAL: no distress Pulses: dorsalis pedis intact bilat.   MSK: no deformity of the feet CV: no leg edema Skin:  no ulcer on the feet.  normal color and temp on the feet. Neuro: sensation is intact to touch on the feet.   A1c=7.5%     Assessment & Plan:  Type 1 DM: he needs increased rx, if it can be done with a regimen that avoids or minimizes hypoglycemia.   Patient Instructions  Please come back for a follow-up appointment in 3 months.   check your blood sugar 8 times a day--before the 3 meals, and at bedtime.  also check if you have symptoms of your blood sugar being too high or too low.  please keep a record of the readings and bring it to your next appointment here.  please call us sooner if your blood sugar goes below 70, or if you have a lot of readings over 200.  For now, please take these settings: basal rate of 1 unit/hr, 24 hrs per day.  mealtime bolus of 1 unit/9 grams carbohydrate, at all meals and snacks.   correction bolus (which some people call  "sensitivity," or "insulin sensitivity ratio," or just "isr") of 1 unit for each by 30 which your glucose exceeds 120.   Please come back for a follow-up appointment in 2 months.

## 2018-01-30 DIAGNOSIS — E109 Type 1 diabetes mellitus without complications: Secondary | ICD-10-CM | POA: Diagnosis not present

## 2018-02-24 ENCOUNTER — Other Ambulatory Visit: Payer: Self-pay | Admitting: Endocrinology

## 2018-02-24 ENCOUNTER — Other Ambulatory Visit: Payer: Self-pay | Admitting: Internal Medicine

## 2018-02-26 DIAGNOSIS — E109 Type 1 diabetes mellitus without complications: Secondary | ICD-10-CM | POA: Diagnosis not present

## 2018-03-01 ENCOUNTER — Telehealth: Payer: Self-pay | Admitting: Endocrinology

## 2018-03-01 ENCOUNTER — Telehealth: Payer: Self-pay | Admitting: Internal Medicine

## 2018-03-01 MED ORDER — INSULIN LISPRO 100 UNIT/ML (KWIKPEN): PEN_INJECTOR | SUBCUTANEOUS | 2 refills | Status: DC

## 2018-03-01 MED ORDER — INSULIN PEN NEEDLE 32G X 4 MM MISC: 5 refills | Status: DC

## 2018-03-01 MED ORDER — GLUCOSE BLOOD VI STRP: ORAL_STRIP | 11 refills | Status: AC

## 2018-03-01 NOTE — Telephone Encounter (Signed)
Attempted to contact pt regarding prescription transfer of prescription; left message at [phone]; instructed pt to contact either pharmacy to initiate prescription transfer.

## 2018-03-01 NOTE — Telephone Encounter (Signed)
insulin lispro (HUMALOG) 100 UNIT/ML injection   CONTOUR NEXT TEST test strip   Also need his pen needles sent.    [facility] - [place] - [address]

## 2018-03-01 NOTE — Telephone Encounter (Signed)
Copied from CRM [phone]. Topic: Quick Communication - See Telephone Encounter >> Mar 01, 2018 10:08 AM Everardo PacificMoton, Bridger Pizzi, NT wrote: CRM for notification. Patient is calling because he needs his prescription  for  Ropinirole 0.5 mg medication sent to [facility] pharmacy [facility]  [address] [phone]. If someone could give him a call back when this is all taking care of for him at [phone]

## 2018-03-01 NOTE — Telephone Encounter (Signed)
Rx submitted

## 2018-03-02 DIAGNOSIS — J029 Acute pharyngitis, unspecified: Secondary | ICD-10-CM | POA: Diagnosis not present

## 2018-03-02 DIAGNOSIS — E109 Type 1 diabetes mellitus without complications: Secondary | ICD-10-CM | POA: Diagnosis not present

## 2018-03-06 ENCOUNTER — Ambulatory Visit: Payer: Self-pay | Admitting: Endocrinology

## 2018-03-14 ENCOUNTER — Ambulatory Visit: Payer: 59 | Admitting: Endocrinology

## 2018-03-14 ENCOUNTER — Encounter: Payer: Self-pay | Admitting: Endocrinology

## 2018-03-14 VITALS — BP 128/70 | HR 78 | Temp 98.2°F | Ht 68.0 in | Wt 213.8 lb

## 2018-03-14 DIAGNOSIS — E109 Type 1 diabetes mellitus without complications: Secondary | ICD-10-CM

## 2018-03-14 LAB — POCT GLYCOSYLATED HEMOGLOBIN (HGB A1C): Hemoglobin A1C: 8

## 2018-03-14 MED ORDER — INSULIN LISPRO 100 UNIT/ML (KWIKPEN): PEN_INJECTOR | SUBCUTANEOUS | 2 refills | Status: DC

## 2018-03-14 MED ORDER — INSULIN LISPRO 100 UNIT/ML (KWIKPEN): 10.0000 [IU] | PEN_INJECTOR | Freq: Three times a day (TID) | SUBCUTANEOUS | 2 refills | Status: DC

## 2018-03-14 MED ORDER — BASAGLAR KWIKPEN 100 UNIT/ML ~~LOC~~ SOPN: 22.0000 [IU] | PEN_INJECTOR | Freq: Every day | SUBCUTANEOUS | 5 refills | Status: DC

## 2018-03-14 NOTE — Patient Instructions (Addendum)
Please come back for a follow-up appointment in 3 months.   check your blood sugar 8 times a day--before the 3 meals, and at bedtime.  also check if you have symptoms of your blood sugar being too high or too low.  please keep a record of the readings and bring it to your next appointment here.  please call us sooner if your blood sugar goes below 70, or if you have a lot of readings over 200.  For now, please take these settings: basal rate of 1 unit/hr, 24 hrs per day (when not in auto-mode) mealtime bolus of 1 unit/8 grams carbohydrate, at all meals and snacks.   correction bolus (which some people call "sensitivity," or "insulin sensitivity ratio," or just "isr") of 1 unit for each by 20 which your glucose exceeds 120.   When not on the pump, please take the insulins listed below.   Please come back for a follow-up appointment in 2 months.

## 2018-03-14 NOTE — Progress Notes (Signed)
Subjective:    Patient ID: Andrew Ruiz, male    DOB: 21-Dec-1990, 27 y.o.   MRN: [id number]  HPI Pt returns for f/u of diabetes mellitus: DM type: 1 Dx'ed: 2014.  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   Interval history:  He takes these pump settings:  basal rate of 1 unit/hr, 24 hrs per day (when not in "auto mode").    mealtime bolus of 1 unit per 10 grams of CHO.   correction bolus of 1 unit for each 30 by which cbg exceeds 100.   TDD is approx 100 total units per day.  2 weeks ago, he stopped pump, due to the cost, but he resumed today.  When he is off the pump, he does not use the continuous glucose monitor.  He seldom has hypoglycemia, and these episodes are mild.  When on the pump, cbg's were higher pc than qc When not on the pump, he takes basaglar, 22/d, and humalog 10-20 units 3 times a day (just before each meal) Past Medical History:  Diagnosis Date  . ADHD (attention deficit hyperactivity disorder)    [facility]  . Allergy   . Diabetes mellitus without complication (HCC)   . History of precordial chest pain   . Pilonidal abscess   . Pilonidal cyst with abscess 08/28/2011  . Ventricular pre-excitation    on ekg card eval  no restrictions    No past surgical history on file.  Social History   Socioeconomic History  . Marital status: Single    Spouse name: Not on file  . Number of children: Not on file  . Years of education: Not on file  . Highest education level: Not on file  Occupational History  . Not on file  Social Needs  . Financial resource strain: Not on file  . Food insecurity:    Worry: Not on file    Inability: Not on file  . Transportation needs:    Medical: Not on file   Non-medical: Not on file  Tobacco Use  . Smoking status: Never Smoker  . Smokeless tobacco: Never Used  Substance and Sexual Activity  . Alcohol use: No  . Drug use: No  . Sexual activity: Not on file  Lifestyle  . Physical activity:    Days per week: Not on file    Minutes per session: Not on file  . Stress: Not on file  Relationships  . Social connections:    Talks on phone: Not on file    Gets together: Not on file    Attends religious service: Not on file    Active member of club or organization: Not on file    Attends meetings of clubs or organizations: Not on file    Relationship status: Not on file  . Intimate partner violence:    Fear of current or ex partner: Not on file    Emotionally abused: Not on file    Physically abused: Not on file    Forced sexual activity: Not on file  Other Topics Concern  . Not on file  Social History Narrative   ** Merged History Encounter **       HH of 4  Pets Cats,dogs and birdWas at [facility] [employer]. 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    Lives alone in an apartment    Current Outpatient Medications on File Prior to Visit  Medication Sig Dispense Refill  . ACCU-CHEK FASTCLIX LANCETS MISC     . cetirizine (ZYRTEC) 10 MG tablet Take 10 mg by mouth daily.    . Diclofenac Sodium (PENNSAID) 2 % SOLN Place 2 application onto the skin 2 (two) times daily. 112 g 3  . glucagon (GLUCAGON EMERGENCY) 1 MG injection Inject 1 mg into the vein once as needed. 1 each 12  . glucose blood (CONTOUR NEXT TEST) test strip TEST 8 TIMES PER DAY, AND LANCETS 8/DAY 240 each 11  . ibuprofen (ADVIL,MOTRIN) 600 MG tablet Take 1 tablet (600 mg total) by mouth every 6 (six) hours as needed. 30 tablet 0  . Insulin Infusion Pump Supplies (INSULIN PUMP SYRINGE RESERVOIR) MISC 1 Device by Does not apply route every 3 (three) days. 30 each 3  . Insulin Infusion Pump Supplies (ULTRAFLEX) MISC 1 Device by Does not  apply route every 3 (three) days. 80 cm tube length, 8 mm catheter length, ref # [id number] 10 each 1  . Insulin Pen Needle 32G X 4 MM MISC Use to inject insulin 4 times per day. 400 each 5  . Melatonin 5 MG TABS Take by mouth. PRN Sleep 1-2 tabs    . rOPINIRole (REQUIP) 0.5 MG tablet TAKE 1 TABLET BY MOUTH 1-3 HOURS BEFORE BED. 30 tablet 1  . traMADol (ULTRAM) 50 MG tablet Take 1 tablet (50 mg total) by mouth every 6 (six) hours as needed. 15 tablet 0   No current facility-administered medications on file prior to visit.     No Known Allergies  Family History  Problem Relation Age of Onset  . ADD / ADHD Brother   . Restless legs syndrome Unknown        grandmother on sinemet?    BP 128/70 (BP Location: Right Arm, Patient Position: Sitting, Cuff Size: Normal)   Pulse 78   Temp 98.2 F (36.8 C) (Oral)   Ht 5\' 8"  (1.727 m)   Wt 213 lb 12.8 oz (97 kg)   SpO2 97%   BMI 32.51 kg/m   Review of Systems Denies LOC    Objective:   Physical Exam VITAL SIGNS:  See vs page.  GENERAL: no distress Pulses: foot pulses are intact bilaterally.   MSK: no deformity of the feet or ankles.  CV: no edema of the legs or ankles Skin:  no ulcer on the feet or ankles.  normal color and temp on the feet and ankles Neuro: sensation is intact to touch on the feet and ankles.    Lab Results  Component Value Date   HGBA1C 8.0 03/14/2018       Assessment & Plan:  Type 1 DM: worse.   Patient Instructions  Please come back for a follow-up appointment in 3 months.   check your blood sugar 8 times a day--before the 3 meals, and at bedtime.  also check if you have symptoms of your blood sugar being too high or too low.  please keep a record of the readings and bring it to your next appointment here.  please call us sooner if your blood sugar goes below 70, or if you have a lot of readings over 200.  For now,  please take these settings: basal rate of 1 unit/hr, 24 hrs per day (when not in  auto-mode) mealtime bolus of 1 unit/8 grams carbohydrate, at all meals and snacks.   correction bolus (which some people call "sensitivity," or "insulin sensitivity ratio," or just "isr") of 1 unit for each by 20 which your glucose exceeds 120.   When not on the pump, please take the insulins listed below.   Please come back for a follow-up appointment in 2 months.

## 2018-04-01 ENCOUNTER — Other Ambulatory Visit: Payer: Self-pay | Admitting: Internal Medicine

## 2018-04-02 ENCOUNTER — Telehealth: Payer: Self-pay | Admitting: Endocrinology

## 2018-04-02 NOTE — Telephone Encounter (Signed)
triamcinolone acetonide cream He would like the largest tube that can be sent.    Patient stated that doctor have prescribed this before for poison ivy. Patient needs someone to write a new prescription for this and sent asap. He states he is covered in poison ivy   Please advise   [facility] - [place] - [address]

## 2018-04-03 NOTE — Telephone Encounter (Signed)
LVM that patient needed to contact PCP for this medication.

## 2018-04-03 NOTE — Telephone Encounter (Signed)
Please ask PCP.  Someone erroneously changed PCP to me on 03/14/18.

## 2018-04-12 ENCOUNTER — Ambulatory Visit
Admission: RE | Admit: 2018-04-12 | Discharge: 2018-04-12 | Disposition: A | Discharge status: Home / Self Care | Payer: Worker's Compensation | Source: Ambulatory Visit | Attending: Nurse Practitioner | Admitting: Nurse Practitioner

## 2018-04-12 ENCOUNTER — Other Ambulatory Visit: Payer: Self-pay | Admitting: Nurse Practitioner

## 2018-04-12 DIAGNOSIS — M25572 Pain in left ankle and joints of left foot: Secondary | ICD-10-CM

## 2018-06-03 ENCOUNTER — Other Ambulatory Visit: Payer: Self-pay | Admitting: Internal Medicine

## 2018-06-03 ENCOUNTER — Ambulatory Visit: Payer: Self-pay | Admitting: Endocrinology

## 2018-06-05 NOTE — Telephone Encounter (Signed)
Denied.  Pt needs an appt 

## 2018-06-11 ENCOUNTER — Telehealth: Payer: Self-pay | Admitting: Endocrinology

## 2018-06-11 ENCOUNTER — Other Ambulatory Visit: Payer: Self-pay

## 2018-06-11 MED ORDER — INSULIN LISPRO 100 UNIT/ML (KWIKPEN): 10.0000 [IU] | PEN_INJECTOR | Freq: Three times a day (TID) | SUBCUTANEOUS | 2 refills | Status: DC

## 2018-06-11 MED ORDER — BASAGLAR KWIKPEN 100 UNIT/ML ~~LOC~~ SOPN: 22.0000 [IU] | PEN_INJECTOR | Freq: Every day | SUBCUTANEOUS | 5 refills | Status: DC

## 2018-06-11 MED ORDER — BASAGLAR KWIKPEN 100 UNIT/ML ~~LOC~~ SOPN
32.0000 [IU] | PEN_INJECTOR | Freq: Every day | SUBCUTANEOUS | 5 refills | Status: DC
Start: 2018-06-11 — End: 2018-06-27

## 2018-06-11 NOTE — Telephone Encounter (Signed)
Patient is going off pump for the summer & has been taking basaglar. He has been taking 32 units & prescription only reflects 22 units. He is almost out of insulin. He stated that most blood sugars are around 200 & made follow up for two weeks out. Is it ok to send in a new prescription reflecting the 32 units until patient is seen?

## 2018-06-11 NOTE — Telephone Encounter (Signed)
I called patient & let him know that new prescription was sent in. I stated that if he had trouble with the pharmacy he would give us a call.

## 2018-06-11 NOTE — Telephone Encounter (Signed)
insulin lispro (HUMALOG KWIKPEN) 100 UNIT/ML KiwkPen   Patient stated he has a question about his insulin and would like a call back    Please advise

## 2018-06-11 NOTE — Telephone Encounter (Signed)
Ok, I have sent a prescription to your pharmacy 

## 2018-06-27 ENCOUNTER — Encounter: Payer: Self-pay | Admitting: Endocrinology

## 2018-06-27 ENCOUNTER — Ambulatory Visit: Payer: 59 | Admitting: Endocrinology

## 2018-06-27 VITALS — BP 118/84 | HR 82 | Wt 217.6 lb

## 2018-06-27 DIAGNOSIS — E109 Type 1 diabetes mellitus without complications: Secondary | ICD-10-CM | POA: Diagnosis not present

## 2018-06-27 LAB — MICROALBUMIN / CREATININE URINE RATIO
Creatinine,U: 98.6 mg/dL
Microalb Creat Ratio: 0.7 mg/g (ref 0.0–30.0)
Microalb, Ur: <0.7 mg/dL (ref 0.0–1.9)

## 2018-06-27 LAB — POCT GLYCOSYLATED HEMOGLOBIN (HGB A1C): HEMOGLOBIN A1C: 7.7 % — AB (ref 4.0–5.6)

## 2018-06-27 MED ORDER — BASAGLAR KWIKPEN 100 UNIT/ML ~~LOC~~ SOPN: 40.0000 [IU] | PEN_INJECTOR | Freq: Every day | SUBCUTANEOUS | 5 refills | Status: DC

## 2018-06-27 MED ORDER — INSULIN LISPRO 100 UNIT/ML (KWIKPEN): 15.0000 [IU] | PEN_INJECTOR | Freq: Three times a day (TID) | SUBCUTANEOUS | 2 refills | Status: DC

## 2018-06-27 NOTE — Progress Notes (Signed)
Subjective:    Patient ID: Andrew Ruiz, male    DOB: 1991-03-17, 27 y.o.   MRN: [id number]  HPI Pt returns for f/u of diabetes mellitus: DM type: 1 Dx'ed: 2014.  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   Interval history: he stopped pump, due to summer activities, and the cost of supplies.  He takes basaglar, 35/d, and humalog 10-20 units 3 times a day (just before each meal).  Meter is downloaded today, and the printout is scanned into the record.  cbg varies from 79-322.  There is no trend throughout the day. Past Medical History:  Diagnosis Date  . ADHD (attention deficit hyperactivity disorder)    [facility]  . Allergy   . Diabetes mellitus without complication (HCC)   . History of precordial chest pain   . Pilonidal abscess   . Pilonidal cyst with abscess 08/28/2011  . Ventricular pre-excitation    on ekg card eval  no restrictions    History reviewed. No pertinent surgical history.  Social History   Socioeconomic History  . Marital status: Single    Spouse name: Not on file  . Number of children: Not on file  . Years of education: Not on file  . Highest education level: Not on file  Occupational History  . Not on file  Social Needs  . Financial resource strain: Not on file  . Food insecurity:    Worry: Not on file    Inability: Not on file  . Transportation needs:    Medical: Not on file    Non-medical: Not on file  Tobacco Use  . Smoking status: Never Smoker  . Smokeless tobacco: Never Used  Substance and Sexual Activity  . Alcohol use: No  . Drug use: No  . Sexual activity: Not on file  Lifestyle  . Physical activity:    Days per week: Not on file    Minutes per session: Not on file  .  Stress: Not on file  Relationships  . Social connections:    Talks on phone: Not on file    Gets together: Not on file    Attends religious service: Not on file    Active member of club or organization: Not on file    Attends meetings of clubs or organizations: Not on file    Relationship status: Not on file  . Intimate partner violence:    Fear of current or ex partner: Not on file    Emotionally abused: Not on file    Physically abused: Not on file    Forced sexual activity: Not on file  Other Topics Concern  . Not on file  Social History Narrative   ** Merged History Encounter **       HH of 4   Pets Cats,dogs and birdWas at [employer]. 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    Lives alone in an apartment    Current Outpatient Medications on File Prior to Visit  Medication Sig Dispense Refill  . ACCU-CHEK FASTCLIX LANCETS MISC     . cetirizine (ZYRTEC) 10 MG tablet Take 10 mg by  mouth daily.    . Diclofenac Sodium (PENNSAID) 2 % SOLN Place 2 application onto the skin 2 (two) times daily. 112 g 3  . glucagon (GLUCAGON EMERGENCY) 1 MG injection Inject 1 mg into the vein once as needed. 1 each 12  . glucose blood (CONTOUR NEXT TEST) test strip TEST 8 TIMES PER DAY, AND LANCETS 8/DAY 240 each 11  . ibuprofen (ADVIL,MOTRIN) 600 MG tablet Take 1 tablet (600 mg total) by mouth every 6 (six) hours as needed. 30 tablet 0  . Insulin Infusion Pump Supplies (ULTRAFLEX) MISC 1 Device by Does not apply route every 3 (three) days. 80 cm tube length, 8 mm catheter length, ref # [id number] 10 each 1  . Insulin Pen Needle 32G X 4 MM MISC Use to inject insulin 4 times per day. 400 each 5  . Melatonin 5 MG TABS Take by mouth. PRN Sleep 1-2 tabs    . rOPINIRole (REQUIP) 0.5 MG tablet TAKE 1 TABLET BY MOUTH 1-3 HOURS BEFORE BED 30 tablet 1  . traMADol (ULTRAM) 50 MG tablet Take 1 tablet (50 mg total) by mouth every 6 (six) hours as needed. 15  tablet 0  . Insulin Infusion Pump Supplies (INSULIN PUMP SYRINGE RESERVOIR) MISC 1 Device by Does not apply route every 3 (three) days. (Patient not taking: Reported on 06/27/2018) 30 each 3   No current facility-administered medications on file prior to visit.     No Known Allergies  Family History  Problem Relation Age of Onset  . ADD / ADHD Brother   . Restless legs syndrome Unknown        grandmother on sinemet?    BP 118/84 (BP Location: Left Arm, Patient Position: Sitting, Cuff Size: Normal)   Pulse 82   Wt 217 lb 9.6 oz (98.7 kg)   SpO2 96%   BMI 33.09 kg/m   Review of Systems He denies hypoglycemia.      Objective:   Physical Exam VITAL SIGNS:  See vs page GENERAL: no distress Pulses: dorsalis pedis intact bilat.   MSK: no deformity of the feet CV: no leg edema Skin:  no ulcer on the feet.  normal color and temp on the feet. Neuro: sensation is intact to touch on the feet.   A1c=7.0%     Assessment & Plan:  Type 1 DM: The pattern of his cbg's indicates he needs some adjustment in his therapy.    Patient Instructions  check your blood sugar 8 times a day--before the 3 meals, and at bedtime.  also check if you have symptoms of your blood sugar being too high or too low.  please keep a record of the readings and bring it to your next appointment here.  please call us sooner if your blood sugar goes below 70, or if you have a lot of readings over 200.  please take the insulins listed below.   Please come back for a follow-up appointment in 3 months.

## 2018-06-27 NOTE — Patient Instructions (Addendum)
check your blood sugar 8 times a day--before the 3 meals, and at bedtime.  also check if you have symptoms of your blood sugar being too high or too low.  please keep a record of the readings and bring it to your next appointment here.  please call us sooner if your blood sugar goes below 70, or if you have a lot of readings over 200.  please take the insulins listed below.   Please come back for a follow-up appointment in 3 months.

## 2018-07-02 ENCOUNTER — Other Ambulatory Visit: Payer: Self-pay | Admitting: Internal Medicine

## 2018-07-18 ENCOUNTER — Telehealth: Payer: Self-pay | Admitting: Endocrinology

## 2018-07-18 NOTE — Telephone Encounter (Signed)
Please call patient at ph# [phone]. He has a question about his medication.

## 2018-07-18 NOTE — Telephone Encounter (Signed)
Pt wanted to discuss if he got enough from the pharmacy to last him 30 days on his Basaglar and his Humalog. Pt will be calling in 30 days to let us know about him going back on his pump.

## 2018-07-23 DIAGNOSIS — Z794 Long term (current) use of insulin: Secondary | ICD-10-CM | POA: Diagnosis not present

## 2018-07-23 DIAGNOSIS — E109 Type 1 diabetes mellitus without complications: Secondary | ICD-10-CM | POA: Diagnosis not present

## 2018-08-07 ENCOUNTER — Telehealth: Payer: Self-pay | Admitting: Emergency Medicine

## 2018-08-07 ENCOUNTER — Other Ambulatory Visit: Payer: Self-pay

## 2018-08-07 MED ORDER — INSULIN LISPRO 100 UNIT/ML (KWIKPEN): 20.0000 [IU] | PEN_INJECTOR | Freq: Three times a day (TID) | SUBCUTANEOUS | 2 refills | Status: DC

## 2018-08-07 NOTE — Telephone Encounter (Signed)
Pt called and asked that you call him about his Humalog. He states the way the prescription is written they are shorting him a week on insulin. Thanks.

## 2018-08-07 NOTE — Telephone Encounter (Signed)
I spoke with patient & he stated that he uses more than 20 units per meal. Also pharmacy is calculating the dosing instead of going by how many boxes of pens we send in. He only gets one box per month. Can we increase how much humalog prescription is written for? Please advise?

## 2018-08-07 NOTE — Telephone Encounter (Signed)
Ok, I increased dosage and resent.

## 2018-08-07 NOTE — Telephone Encounter (Signed)
I called patient & asked him to call back to clarify how much he was taking to see if we should send prescription as increased.

## 2018-08-08 NOTE — Telephone Encounter (Signed)
Pt called back and stated a new prescription for his Humalog was called into the pharmacy but it is still nto enough. He asked that you give him a call back thanks.

## 2018-08-09 NOTE — Telephone Encounter (Signed)
I sent rx for 10 pens per month.  This should be enough for up to 90 units per day.  If this is still not enough, I need to know more.

## 2018-08-09 NOTE — Telephone Encounter (Signed)
Please advise 

## 2018-08-28 ENCOUNTER — Other Ambulatory Visit: Payer: Self-pay | Admitting: Internal Medicine

## 2018-09-24 ENCOUNTER — Telehealth: Payer: Self-pay | Admitting: Endocrinology

## 2018-09-24 NOTE — Telephone Encounter (Signed)
Patient is stating insurance would like RX resent to pharmacy insulin lispro (HUMALOG KWIKPEN) 100 UNIT/ML KiwkPen, Insulin Glargine (BASAGLAR KWIKPEN) 100 UNIT/ML SOPN. [facility] [place] [address]

## 2018-09-26 ENCOUNTER — Telehealth: Payer: Self-pay | Admitting: Endocrinology

## 2018-09-26 ENCOUNTER — Telehealth: Payer: Self-pay

## 2018-09-26 MED ORDER — INSULIN LISPRO 100 UNIT/ML (KWIKPEN): 20.0000 [IU] | PEN_INJECTOR | Freq: Three times a day (TID) | SUBCUTANEOUS | 2 refills | Status: DC

## 2018-09-26 MED ORDER — BASAGLAR KWIKPEN 100 UNIT/ML ~~LOC~~ SOPN: 40.0000 [IU] | PEN_INJECTOR | Freq: Every day | SUBCUTANEOUS | 5 refills | Status: AC

## 2018-09-26 NOTE — Telephone Encounter (Signed)
Received call from [facility] asking for clarification of pt Humalog order. States pt is requesting 2 boxes but in order to distribute 2 boxes, Rx will need to specify Humalog 20 units qd but may inject up to 100 units qd. Message sent to Dr. Everardo All for further direction. Will await his response.

## 2018-09-26 NOTE — Telephone Encounter (Signed)
Patient has called again stating this has not been done and he is out of insulin. Please Advise. "Patient called.  He needs his rx for Humalog 10  written 20 units 3 x a day up to 100 units a day. Last time patient ran out of his medication because it was written for 2 boxes and they only gave him 1 box. Patient uses [facility]."

## 2018-09-26 NOTE — Addendum Note (Signed)
Addended by: Peterson Ao, Junious Ragone J on: 09/26/2018 04:10 PM   Modules accepted: Orders

## 2018-09-26 NOTE — Telephone Encounter (Signed)
For patient safety, the prescription needs to be for the actual dosage.  However, this dosage should get him 2 boxes per month.  If pt is requiring more thn 2 boxes per month, please let us know.

## 2018-09-26 NOTE — Telephone Encounter (Signed)
Pt and pt ins has requested Basaglar Stephanie Coup and Humalog Stephanie Coup be sent to [facility]. Rx's resent electronically.

## 2018-09-26 NOTE — Telephone Encounter (Signed)
Patient called.  He needs his rx for Humalog 10  written 20 units 3 x a day up to 100 units a day. Last time patient ran out of his medication because it was written for 2 boxes and they only gave him 1 box. Patient uses [facility].

## 2018-09-27 ENCOUNTER — Telehealth: Payer: Self-pay

## 2018-09-27 ENCOUNTER — Other Ambulatory Visit: Payer: Self-pay

## 2018-09-27 ENCOUNTER — Telehealth: Payer: Self-pay | Admitting: Endocrinology

## 2018-09-27 MED ORDER — INSULIN LISPRO 100 UNIT/ML (KWIKPEN): 20.0000 [IU] | PEN_INJECTOR | Freq: Three times a day (TID) | SUBCUTANEOUS | 2 refills | Status: DC

## 2018-09-27 NOTE — Telephone Encounter (Signed)
There are 2 sets of directions: 1 says 20 units qd, and the other says 20 units 3 times a day (just before each meal).  All we need to do is to send the rx that says tid

## 2018-09-27 NOTE — Telephone Encounter (Signed)
Pt stated that he does adjust his insulin as needed anywhere from 20- 30 units

## 2018-09-27 NOTE — Telephone Encounter (Signed)
See previous note

## 2018-09-27 NOTE — Telephone Encounter (Signed)
Patient is returning missed call concerning RX that was noted in last message. Please Advise.

## 2018-09-27 NOTE — Addendum Note (Signed)
Addended by: Romero Belling on: 09/27/2018 02:50 PM   Modules accepted: Orders

## 2018-09-27 NOTE — Telephone Encounter (Signed)
Dr. Everardo All stated that he will not document up to 100 units on rx and suggested pt to try another pharmacy, spoke to pt and he stated that he would call back when he decides

## 2018-09-27 NOTE — Telephone Encounter (Signed)
OK, I have updated and resent

## 2018-09-27 NOTE — Telephone Encounter (Signed)
Discussed with Dr. Everardo All about pt request. States, for safety reasons, he does not feel comfortable writing the Rx to states Humalog 20 units TID with max dose of 100 units. Since pt self adjusts doses, advised I call the pt to inquire what range of units does he inject (20-30, 20-40, etc.). Per Dr. Everardo All, Rx will be written to include this range but not to state max dose of 100 units.   Called to discuss with pt. States he is at work and cannot discuss at this time. Asked me to call him back later today.

## 2018-09-27 NOTE — Telephone Encounter (Signed)
Pt stated that insurance will not provide the requested 2 boxes unless rx is written to say inject up to 100 units per day, please advise. 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

## 2018-09-27 NOTE — Telephone Encounter (Signed)
-----   Message from Romero Belling, MD sent at 09/26/2018  4:54 PM EDT ----- Regarding: RE: Humalog No, it is 20 units 3 times a day (just before each meal)  ----- Message ----- From: Deon Pilling, LPN Sent: 16/09/9603   4:35 PM EDT To: Romero Belling, MD Subject: Humalog                                        Received call from [facility]. According to the pharamacist, pt was given 1 box of Humalog. In order for pt to receive 2 boxes as he is requesting, pharmacist states the Rx will need to be changed to state Humalog 20 units qd but may inject up to 100 units qd. This will allow the pharamacy to distribute 2 boxes to the pt as he is requesting. Previous Rx has been voided by the pharmacist. He is requesting a new Rx be sent if you ARE permitting 2 boxes. Please advise.  Thanks, Valincia Touch

## 2018-09-30 ENCOUNTER — Telehealth: Payer: Self-pay | Admitting: Internal Medicine

## 2018-09-30 DIAGNOSIS — E109 Type 1 diabetes mellitus without complications: Secondary | ICD-10-CM

## 2018-09-30 NOTE — Telephone Encounter (Signed)
Copied from CRM [id number]. Topic: Quick Communication - See Telephone Encounter >> Sep 30, 2018 12:43 PM Jens Som A wrote: CRM for notification. See Telephone encounter for: 09/30/18.  Patient is calling to requesting Dr. Fabian Sharp to place a referral for endocrinology Patietn currently sees Dr. Everardo All.  Patinet is requesting the referral to be placed with Dr. Patrecia Pace at [facility]. Phone [phone]  Patient call back number [phone]

## 2018-09-30 NOTE — Telephone Encounter (Signed)
Per Dr Fabian Sharp the referral was approved.  Referral placed and I left a message for the pt to return a call to the office.  CRM also created.

## 2018-10-01 ENCOUNTER — Telehealth: Payer: Self-pay | Admitting: *Deleted

## 2018-10-01 NOTE — Telephone Encounter (Signed)
See prior note-patient informed Andrew Ruiz    Copied from CRM [id number]. Topic: General - Other >> Oct 01, 2018 10:58 AM Tamela Oddi wrote: Reason for CRM: Patient is returning call to Alvino Chapel A regarding a referral.  Please call patient back.

## 2018-10-01 NOTE — Telephone Encounter (Signed)
I called the pt and informed him of the message below. 

## 2018-10-02 ENCOUNTER — Other Ambulatory Visit: Payer: Self-pay | Admitting: Internal Medicine

## 2018-10-02 ENCOUNTER — Ambulatory Visit: Payer: Self-pay | Admitting: Endocrinology

## 2018-10-02 NOTE — Telephone Encounter (Signed)
Spoke with patient, he is in the process of trying to find a PCP that can handle all of his needs rather than seeing multiple providers. Pt was referred to Dr Everardo All and was continuing to see dr Fabian Sharp as well and he states that it was getting too confusing on who was handling what. Pt requested a referral to Endo in [place] who would possible also see him as PCP. I advised that I sent a 3 month supply of his medication to the pharmacy but also advised that future refills may be denied and that he needs to try and get settled with a new PCP to take over his refills. Pt will let us know if he is unable to find a new PCP. Nothing further needed.

## 2018-10-03 NOTE — Telephone Encounter (Signed)
I called the office - I am aware that they are not on Epic I was told to fax notes over - once reviewed they will contact pt to schedule informed pt of this  [facility] [place], [place] Address: [address] [place], [place] [address] [phone]

## 2018-10-03 NOTE — Telephone Encounter (Signed)
Marcelino Duster with Dr. Gregery Na office calling and states they are not on Epic and will need the referral as well as office notes, labs, reason for referral, and imaging if avaiable faxed to them. Fax#: [phone] CB#: [phone].

## 2018-11-04 DIAGNOSIS — G2581 Restless legs syndrome: Secondary | ICD-10-CM | POA: Diagnosis not present

## 2018-11-04 DIAGNOSIS — L309 Dermatitis, unspecified: Secondary | ICD-10-CM | POA: Diagnosis not present

## 2018-11-04 DIAGNOSIS — E1065 Type 1 diabetes mellitus with hyperglycemia: Secondary | ICD-10-CM | POA: Diagnosis not present

## 2018-11-18 DIAGNOSIS — L309 Dermatitis, unspecified: Secondary | ICD-10-CM | POA: Diagnosis not present

## 2018-11-18 DIAGNOSIS — E1065 Type 1 diabetes mellitus with hyperglycemia: Secondary | ICD-10-CM | POA: Diagnosis not present

## 2018-11-18 DIAGNOSIS — G2581 Restless legs syndrome: Secondary | ICD-10-CM | POA: Diagnosis not present

## 2018-11-25 DIAGNOSIS — L309 Dermatitis, unspecified: Secondary | ICD-10-CM | POA: Diagnosis not present

## 2018-11-25 DIAGNOSIS — E1065 Type 1 diabetes mellitus with hyperglycemia: Secondary | ICD-10-CM | POA: Diagnosis not present

## 2018-11-25 DIAGNOSIS — G2581 Restless legs syndrome: Secondary | ICD-10-CM | POA: Diagnosis not present

## 2018-11-28 DIAGNOSIS — E7801 Familial hypercholesterolemia: Secondary | ICD-10-CM | POA: Diagnosis not present

## 2018-11-28 DIAGNOSIS — E669 Obesity, unspecified: Secondary | ICD-10-CM | POA: Diagnosis not present

## 2018-11-28 DIAGNOSIS — Z6833 Body mass index (BMI) 33.0-33.9, adult: Secondary | ICD-10-CM | POA: Diagnosis not present

## 2018-11-28 DIAGNOSIS — E1065 Type 1 diabetes mellitus with hyperglycemia: Secondary | ICD-10-CM | POA: Diagnosis not present

## 2018-11-28 DIAGNOSIS — G2581 Restless legs syndrome: Secondary | ICD-10-CM | POA: Diagnosis not present

## 2018-11-28 DIAGNOSIS — E785 Hyperlipidemia, unspecified: Secondary | ICD-10-CM | POA: Diagnosis not present

## 2018-12-24 ENCOUNTER — Other Ambulatory Visit: Payer: Self-pay | Admitting: Internal Medicine

## 2018-12-24 NOTE — Telephone Encounter (Signed)
Please advise  Last filled :10/02/18 Last OV:06/20/17

## 2018-12-24 NOTE — Telephone Encounter (Signed)
Last OV with me was  7 2018    Needs ov for on going meds    refills Please contact him  To get on schedule  But can  refill x 1 in the interim

## 2018-12-25 ENCOUNTER — Telehealth: Payer: Self-pay | Admitting: Internal Medicine

## 2018-12-25 ENCOUNTER — Other Ambulatory Visit: Payer: Self-pay | Admitting: Endocrinology

## 2018-12-25 NOTE — Telephone Encounter (Signed)
Denied pt has PCP else where

## 2018-12-25 NOTE — Telephone Encounter (Signed)
Pt states that he has switched doctors and does not need it filled

## 2019-01-10 NOTE — Telephone Encounter (Signed)
Ok to send in 30 pills  Needs OV before further refills  Have him get on schedule and if he needs a specific time we can try working him in to his schedule

## 2019-01-10 NOTE — Telephone Encounter (Signed)
Called Pt and states that he was seeing another provider but he is not taking over primary care but would like to still see Dr.panosh

## 2019-01-10 NOTE — Telephone Encounter (Signed)
Not sure why PCP was changed. However we have not seen pt in office since 06/20/17.

## 2019-01-10 NOTE — Telephone Encounter (Signed)
I was told that he has a new PCP in [place] area   And they would be prescribing medication    But maybe this was incorrect.   See January 14 phone note     I haven't seen for 18 mos   Andrew Ruiz need updated info and office visit to  prescribe medication.  Please advise

## 2019-01-10 NOTE — Telephone Encounter (Signed)
Pt calling to see why medication was denied. Is pt needing an appt? Did ask pt if he has another PCP and he stated that he only sees Dr. Fabian Sharp and a endocrinologist. Please advise pt.

## 2019-01-13 ENCOUNTER — Other Ambulatory Visit: Payer: Self-pay

## 2019-01-13 DIAGNOSIS — E1065 Type 1 diabetes mellitus with hyperglycemia: Secondary | ICD-10-CM | POA: Diagnosis not present

## 2019-01-13 DIAGNOSIS — E669 Obesity, unspecified: Secondary | ICD-10-CM | POA: Diagnosis not present

## 2019-01-13 DIAGNOSIS — E7801 Familial hypercholesterolemia: Secondary | ICD-10-CM | POA: Diagnosis not present

## 2019-01-13 LAB — HEPATIC FUNCTION PANEL
ALT: 49 — AB (ref 10–40)
AST: 24 (ref 14–40)
Alkaline Phosphatase: 74 (ref 25–125)
Bilirubin, Total: 2.1

## 2019-01-13 LAB — LIPID PANEL
Cholesterol: 159 (ref 0–200)
HDL: 36 (ref 35–70)
LDL Cholesterol: 88
Triglycerides: 176 — AB (ref 40–160)

## 2019-01-13 LAB — BASIC METABOLIC PANEL
Creatinine: 1 (ref 0.6–1.3)
Glucose: 262
Potassium: 4.8 (ref 3.4–5.3)
Sodium: 138 (ref 137–147)

## 2019-01-13 LAB — CBC AND DIFFERENTIAL: Hemoglobin: 7.1 — AB (ref 13.5–17.5)

## 2019-01-13 MED ORDER — ROPINIROLE HCL 0.5 MG PO TABS: ORAL_TABLET | ORAL | 0 refills | Status: DC

## 2019-01-13 NOTE — Telephone Encounter (Signed)
Pt will call back to schedule an appt.

## 2019-01-13 NOTE — Telephone Encounter (Signed)
Prescription sent into pharmacy

## 2019-01-20 DIAGNOSIS — G2581 Restless legs syndrome: Secondary | ICD-10-CM | POA: Diagnosis not present

## 2019-01-20 DIAGNOSIS — E669 Obesity, unspecified: Secondary | ICD-10-CM | POA: Diagnosis not present

## 2019-01-20 DIAGNOSIS — E1065 Type 1 diabetes mellitus with hyperglycemia: Secondary | ICD-10-CM | POA: Diagnosis not present

## 2019-01-20 DIAGNOSIS — E7801 Familial hypercholesterolemia: Secondary | ICD-10-CM | POA: Diagnosis not present

## 2019-01-29 ENCOUNTER — Ambulatory Visit: Payer: Self-pay | Admitting: Internal Medicine

## 2019-02-06 NOTE — Progress Notes (Signed)
Chief Complaint  Patient presents with  . Medication Management    restless leg  has lab results    HPI: Andrew Ruiz 27 y.o. come in for reestablishing and  Med management for  rls rls   Bothers   Less.    Without bed partner .   Still working  Police work  getting intermittent  Right lb buttock area pain  Getting back into  exercise  Working out  problematic   Has police belt already  uncertain if  Related   Dm on pump  May be looking for different endo team to get most advance techology for help.s  Is not on phentermine to help with weight loss   and simvastatin  20 mg   Engaged to be married   ROS: See pertinent positives and negatives per HPI.  Past Medical History:  Diagnosis Date  . ADHD (attention deficit hyperactivity disorder)    eval [facility] no ld  . Allergy   . Diabetes mellitus without complication (HCC)   . History of precordial chest pain   . Pilonidal abscess   . Pilonidal cyst with abscess 08/28/2011  . Ventricular pre-excitation    on ekg card eval  no restrictions    Family History  Problem Relation Age of Onset  . ADD / ADHD Brother   . Restless legs syndrome Unknown        grandmother on sinemet?    Social History   Socioeconomic History  . Marital status: Single    Spouse name: Not on file  . Number of children: Not on file  . Years of education: Not on file  . Highest education level: Not on file  Occupational History  . Not on file  Social Needs  . Financial resource strain: Not on file  . Food insecurity:    Worry: Not on file    Inability: Not on file  . Transportation needs:    Medical: Not on file    Non-medical: Not on file  Tobacco Use  . Smoking status: Never Smoker  . Smokeless tobacco: Never Used  Substance and Sexual Activity  . Alcohol use: No  . Drug use: No  . Sexual activity: Not on file  Lifestyle  . Physical activity:    Days per week: Not on file    Minutes per session: Not on file  .  Stress: Not on file  Relationships  . Social connections:    Talks on phone: Not on file    Gets together: Not on file    Attends religious service: Not on file    Active member of club or organization: Not on file    Attends meetings of clubs or organizations: Not on file    Relationship status: Not on file  Other Topics Concern  . Not on file  Social History Narrative   ** Merged History Encounter **       HH of 1-2   Non smoker some exercise   A  Emergency planning/management officer.    Lives alone in an apartment engaged to be married     Outpatient Medications Prior to Visit  Medication Sig Dispense Refill  . ACCU-CHEK FASTCLIX LANCETS MISC     . cetirizine (ZYRTEC) 10 MG tablet Take 10 mg by mouth daily.    . Diclofenac Sodium (PENNSAID) 2 % SOLN Place 2 application onto the skin 2 (two) times daily. 112 g 3  . glucagon (GLUCAGON EMERGENCY)  1 MG injection Inject 1 mg into the vein once as needed. 1 each 12  . glucose blood (CONTOUR NEXT TEST) test strip TEST 8 TIMES PER DAY, AND LANCETS 8/DAY 240 each 11  . HUMALOG KWIKPEN 100 UNIT/ML KwikPen INJECT 0.2-0.3ML (20-30 UNITS TOTAL) INTO THE SKIN 3 TIMES DAILY WITHMEALS 30 mL 1  . ibuprofen (ADVIL,MOTRIN) 600 MG tablet Take 1 tablet (600 mg total) by mouth every 6 (six) hours as needed. 30 tablet 0  . Insulin Glargine (BASAGLAR KWIKPEN) 100 UNIT/ML SOPN Inject 0.4 mLs (40 Units total) into the skin daily. 5 pen 5  . Insulin Infusion Pump Supplies (INSULIN PUMP SYRINGE RESERVOIR) MISC 1 Device by Does not apply route every 3 (three) days. 30 each 3  . Insulin Infusion Pump Supplies (ULTRAFLEX) MISC 1 Device by Does not apply route every 3 (three) days. 80 cm tube length, 8 mm catheter length, ref # [id number] 10 each 1  . Insulin Pen Needle 32G X 4 MM MISC Use to inject insulin 4 times per day. 400 each 5  . Melatonin 5 MG TABS Take by mouth. PRN Sleep 1-2 tabs    . phentermine 37.5 MG capsule Take 37.5 mg by mouth every morning.    . simvastatin  (ZOCOR) 20 MG tablet     . rOPINIRole (REQUIP) 0.5 MG tablet TAKE 1 TABLET BY MOUTH 1 TO 3 HOURS BEFORE BEDTIME 30 tablet 0  . traMADol (ULTRAM) 50 MG tablet Take 1 tablet (50 mg total) by mouth every 6 (six) hours as needed. 15 tablet 0   No facility-administered medications prior to visit.      EXAM:  BP 120/80 (BP Location: Left Arm, Patient Position: Sitting, Cuff Size: Large)   Pulse 92   Temp 98.2 F (36.8 C) (Oral)   Ht  (1.727 m)   Wt 218 lb 6.4 oz (99.1 kg)   BMI 33.21 kg/m   Body mass index is 33.21 kg/m.  GENERAL: vitals reviewed and listed above, alert, oriented, appears well hydrated and in no acute distress HEENT: atraumatic, conjunctiva  clear, no obvious abnormalities on inspection of external nose and ears e  NECK: no obvious masses on inspection palpation  LUNGS: clear to auscultation bilaterally, no wheezes, rales or rhonchi, good air movement CV: HRRR, no clubbing cyanosis or  peripheral edema nl cap refill  Abdomen:  Sof,t normal bowel sounds without hepatosplenomegaly, no guarding rebound or masses no CVA tenderness  MS: moves all extremities without noticeable focal  Abnormality points to r buttiock area  PSYCH: pleasant and cooperative, no obvious depression or anxiety Neuro no focal no tremor  Lab Results  Component Value Date   WBC 8.7 06/18/2017   HGB 15.5 06/18/2017   HCT 43.1 06/18/2017   PLT 203 06/18/2017   GLUCOSE 238 (H) 06/18/2017   CHOL 191 10/27/2015   TRIG 210.0 (H) 10/27/2015   HDL 40.70 10/27/2015   LDLDIRECT 119.0 10/27/2015   ALT 22 06/18/2017   AST 20 06/18/2017   NA 135 06/18/2017   K 4.1 06/18/2017   CL 102 06/18/2017   CREATININE 1.11 06/18/2017   BUN 15 06/18/2017   CO2 24 06/18/2017   TSH 1.67 10/27/2015   HGBA1C 7.7 (A) 06/27/2018   MICROALBUR <0.7 06/27/2018   BP Readings from Last 3 Encounters:  02/07/19 120/80  06/27/18 118/84  03/14/18 128/70   Labs from [facility] reviewed   Alt 49 Ast 24  Creatinine  1.0 A1c 7.1 , tc 159 ldl  88  ASSESSMENT AND PLAN:  Discussed the following assessment and plan:  RLS (restless legs syndrome)  Medication management  Chronic right-sided low back pain without sciatica - Plan: Ambulatory referral to Sports Medicine  Type 1 diabetes mellitus without complication (HCC) Will continue med at this time  Had failed   Gabapentin in past   Ok to continue at this time . Plan cpx in a year if still  Coming tot his area.   He is working on getting his weight down  ( has not been overweight when younger)  -Patient advised to return or notify health care team  if  new concerns arise.  Patient Instructions  Ok   to continue    Medication.  Since seems to help .  Consider referral to sports medicine for back   chiro ok for  Acute problem .   Let me know if you want a referral for endo [facility].     Cpx or rov in  1 year or as needed.     Restless Legs Syndrome Restless legs syndrome is a condition that causes uncomfortable feelings or sensations in the legs, especially while sitting or lying down. The sensations usually cause an overwhelming urge to move the legs. The arms can also sometimes be affected. The condition can range from mild to severe. The symptoms often interfere with a person's ability to sleep. What are the causes? The cause of this condition is not known. What increases the risk? The following factors may make you more likely to develop this condition:  Being older than 50.  Pregnancy.  Being a woman. In general, the condition is more common in women than in men.  A family history of the condition.  Having iron deficiency.  Overuse of caffeine, nicotine, or alcohol.  Certain medical conditions, such as kidney disease, Parkinson's disease, or nerve damage.  Certain medicines, such as those for high blood pressure, nausea, colds, allergies, depression, and some heart conditions. What are the signs or symptoms? The main symptom of this  condition is uncomfortable sensations in the legs, such as:  Pulling.  Tingling.  Prickling.  Throbbing.  Crawling.  Burning. Usually, the sensations:  Affect both sides of the body.  Are worse when you sit or lie down.  Are worse at night. These may wake you up or make it difficult to fall asleep.  Make you have a strong urge to move your legs.  Are temporarily relieved by moving your legs. The arms can also be affected, but this is rare. People who have this condition often have tiredness during the day because of their lack of sleep at night. How is this diagnosed? This condition may be diagnosed based on:  Your symptoms.  Blood tests. In some cases, you may be monitored in a sleep lab by a specialist (a sleep study). This can detect any disruptions in your sleep. How is this treated? This condition is treated by managing the symptoms. This may include:  Lifestyle changes, such as exercising, using relaxation techniques, and avoiding caffeine, alcohol, or tobacco.  Medicines. Anti-seizure medicines may be tried first. Follow these instructions at home:     General instructions  Take over-the-counter and prescription medicines only as told by your health care provider.  Use methods to help relieve the uncomfortable sensations, such as: ? Massaging your legs. ? Walking or stretching. ? Taking a cold or hot bath.  Keep all follow-up visits as told by your health care provider. This  is important. Lifestyle  Practice good sleep habits. For example, go to bed and get up at the same time every day. Most adults should get 7-9 hours of sleep each night.  Exercise regularly. Try to get at least 30 minutes of exercise most days of the week.  Practice ways of relaxing, such as yoga or meditation.  Avoid caffeine and alcohol.  Do not use any products that contain nicotine or tobacco, such as cigarettes and e-cigarettes. If you need help quitting, ask your health  care provider. Contact a health care provider if:  Your symptoms get worse or they do not improve with treatment. Summary  Restless legs syndrome is a condition that causes uncomfortable feelings or sensations in the legs, especially while sitting or lying down.  The symptoms often interfere with a person's ability to sleep.  This condition is treated by managing the symptoms. You may need to make lifestyle changes or take medicines. This information is not intended to replace advice given to you by your health care provider. Make sure you discuss any questions you have with your health care provider. Document Released: 11/17/2002 Document Revised: 12/17/2017 Document Reviewed: 12/17/2017 Elsevier Interactive Patient Education  2019 [employer].               Andrew Ruiz. Andrew Ruiz M.D.

## 2019-02-07 ENCOUNTER — Encounter: Payer: Self-pay | Admitting: Internal Medicine

## 2019-02-07 ENCOUNTER — Ambulatory Visit: Payer: 59 | Admitting: Internal Medicine

## 2019-02-07 VITALS — BP 120/80 | HR 92 | Temp 98.2°F | Ht 68.0 in | Wt 218.4 lb

## 2019-02-07 DIAGNOSIS — M545 Low back pain: Secondary | ICD-10-CM

## 2019-02-07 DIAGNOSIS — G8929 Other chronic pain: Secondary | ICD-10-CM

## 2019-02-07 DIAGNOSIS — E109 Type 1 diabetes mellitus without complications: Secondary | ICD-10-CM | POA: Diagnosis not present

## 2019-02-07 DIAGNOSIS — G2581 Restless legs syndrome: Secondary | ICD-10-CM

## 2019-02-07 DIAGNOSIS — Z79899 Other long term (current) drug therapy: Secondary | ICD-10-CM

## 2019-02-07 MED ORDER — ROPINIROLE HCL 0.5 MG PO TABS: ORAL_TABLET | ORAL | 3 refills | Status: DC

## 2019-02-07 NOTE — Patient Instructions (Addendum)
Ok   to continue    Medication.  Since seems to help .  Consider referral to sports medicine for back   chiro ok for  Acute problem .   Let me know if you want a referral for endo [facility].     Cpx or rov in  1 year or as needed.     Restless Legs Syndrome Restless legs syndrome is a condition that causes uncomfortable feelings or sensations in the legs, especially while sitting or lying down. The sensations usually cause an overwhelming urge to move the legs. The arms can also sometimes be affected. The condition can range from mild to severe. The symptoms often interfere with a person's ability to sleep. What are the causes? The cause of this condition is not known. What increases the risk? The following factors may make you more likely to develop this condition:  Being older than 50.  Pregnancy.  Being a woman. In general, the condition is more common in women than in men.  A family history of the condition.  Having iron deficiency.  Overuse of caffeine, nicotine, or alcohol.  Certain medical conditions, such as kidney disease, Parkinson's disease, or nerve damage.  Certain medicines, such as those for high blood pressure, nausea, colds, allergies, depression, and some heart conditions. What are the signs or symptoms? The main symptom of this condition is uncomfortable sensations in the legs, such as:  Pulling.  Tingling.  Prickling.  Throbbing.  Crawling.  Burning. Usually, the sensations:  Affect both sides of the body.  Are worse when you sit or lie down.  Are worse at night. These may wake you up or make it difficult to fall asleep.  Make you have a strong urge to move your legs.  Are temporarily relieved by moving your legs. The arms can also be affected, but this is rare. People who have this condition often have tiredness during the day because of their lack of sleep at night. How is this diagnosed? This condition may be diagnosed based on:  Your  symptoms.  Blood tests. In some cases, you may be monitored in a sleep lab by a specialist (a sleep study). This can detect any disruptions in your sleep. How is this treated? This condition is treated by managing the symptoms. This may include:  Lifestyle changes, such as exercising, using relaxation techniques, and avoiding caffeine, alcohol, or tobacco.  Medicines. Anti-seizure medicines may be tried first. Follow these instructions at home:     General instructions  Take over-the-counter and prescription medicines only as told by your health care provider.  Use methods to help relieve the uncomfortable sensations, such as: ? Massaging your legs. ? Walking or stretching. ? Taking a cold or hot bath.  Keep all follow-up visits as told by your health care provider. This is important. Lifestyle  Practice good sleep habits. For example, go to bed and get up at the same time every day. Most adults should get 7-9 hours of sleep each night.  Exercise regularly. Try to get at least 30 minutes of exercise most days of the week.  Practice ways of relaxing, such as yoga or meditation.  Avoid caffeine and alcohol.  Do not use any products that contain nicotine or tobacco, such as cigarettes and e-cigarettes. If you need help quitting, ask your health care provider. Contact a health care provider if:  Your symptoms get worse or they do not improve with treatment. Summary  Restless legs syndrome is a condition  that causes uncomfortable feelings or sensations in the legs, especially while sitting or lying down.  The symptoms often interfere with a person's ability to sleep.  This condition is treated by managing the symptoms. You may need to make lifestyle changes or take medicines. This information is not intended to replace advice given to you by your health care provider. Make sure you discuss any questions you have with your health care provider. Document Released: 11/17/2002  Document Revised: 12/17/2017 Document Reviewed: 12/17/2017 Elsevier Interactive Patient Education  2019 [employer].

## 2019-02-10 ENCOUNTER — Encounter: Payer: Self-pay | Admitting: Internal Medicine

## 2019-03-09 NOTE — Progress Notes (Signed)
Andrew Ruiz [address] [place], [place] [address] Phone: [phone] Subjective:   Andrew Ruiz, am serving as a scribe for Dr. Antoine Ruiz.  I'm seeing this patient by the  request  of: Panosh MD   CC: Right-sided low back pain  FAO:[id number]  Andrew Ruiz is a 28 y.o. male coming in with complaint of lower back pain. Has had pain for years. Patient is able to pop his back and get audible pop. Denies any radiating symptoms. Sitting for prolonged periods and when he wears his belt for work increases his pain. Works as a Emergency planning/management officer.  Patient states that it is more of a dull aching sensation, no significant radiation down the legs.  Patient says it does not stop him from activity but sometimes feels like he just needs his back to pop.    Past Medical History:  Diagnosis Date  . ADHD (attention deficit hyperactivity disorder)    [facility]  . Allergy   . Diabetes mellitus without complication (HCC)   . History of precordial chest pain   . Pilonidal abscess   . Pilonidal cyst with abscess 08/28/2011  . Ventricular pre-excitation    on ekg card eval  no restrictions   No past surgical history on file. Social History   Socioeconomic History  . Marital status: Single    Spouse name: Not on file  . Number of children: Not on file  . Years of education: Not on file  . Highest education level: Not on file  Occupational History  . Not on file  Social Needs  . Financial resource strain: Not on file  . Food insecurity:    Worry: Not on file    Inability: Not on file  . Transportation needs:    Medical: Not on file    Non-medical: Not on file  Tobacco Use  . Smoking status: Never Smoker  . Smokeless tobacco: Never Used  Substance and Sexual Activity  . Alcohol use: No  . Drug use: No  . Sexual activity: Not on file  Lifestyle  . Physical activity:    Days per week: Not on file    Minutes per session: Not on  file  . Stress: Not on file  Relationships  . Social connections:    Talks on phone: Not on file    Gets together: Not on file    Attends religious service: Not on file    Active member of club or organization: Not on file    Attends meetings of clubs or organizations: Not on file    Relationship status: Not on file  Other Topics Concern  . Not on file  Social History Narrative   ** Merged History Encounter **       HH of 1-2   Non smoker some exercise   A  Emergency planning/management officer.    Lives alone in an apartment engaged to be married    No Known Allergies Family History  Problem Relation Age of Onset  . ADD / ADHD Brother   . Restless legs syndrome Unknown        grandmother on sinemet?    Current Outpatient Medications (Endocrine & Metabolic):  .  glucagon (GLUCAGON EMERGENCY) 1 MG injection, Inject 1 mg into the vein once as needed. [name]  HUMALOG KWIKPEN 100 UNIT/ML KwikPen, INJECT 0.2-0.3ML (20-30 UNITS TOTAL) INTO THE SKIN 3 TIMES DAILY WITHMEALS .  Insulin Glargine (  BASAGLAR KWIKPEN) 100 UNIT/ML SOPN, Inject 0.4 mLs (40 Units total) into the skin daily.  Current Outpatient Medications (Cardiovascular):  .  simvastatin (ZOCOR) 20 MG tablet,   Current Outpatient Medications (Respiratory):  .  cetirizine (ZYRTEC) 10 MG tablet, Take 10 mg by mouth daily.  Current Outpatient Medications (Analgesics):  .  ibuprofen (ADVIL,MOTRIN) 600 MG tablet, Take 1 tablet (600 mg total) by mouth every 6 (six) hours as needed.   Current Outpatient Medications (Other):  [name]  ACCU-CHEK FASTCLIX LANCETS MISC,  .  Diclofenac Sodium (PENNSAID) 2 % SOLN, Place 2 application onto the skin 2 (two) times daily. [name]  glucose blood (CONTOUR NEXT TEST) test strip, TEST 8 TIMES PER DAY, AND LANCETS 8/DAY .  Insulin Infusion Pump Supplies (INSULIN PUMP SYRINGE RESERVOIR) MISC, 1 Device by Does not apply route every 3 (three) days. .  Insulin Infusion Pump Supplies (ULTRAFLEX) MISC, 1 Device by Does not apply route  every 3 (three) days. 80 cm tube length, 8 mm catheter length, ref # [id number] .  Insulin Pen Needle 32G X 4 MM MISC, Use to inject insulin 4 times per day. .  Melatonin 5 MG TABS, Take by mouth. PRN Sleep 1-2 tabs .  phentermine 37.5 MG capsule, Take 37.5 mg by mouth every morning. [name]  rOPINIRole (REQUIP) 0.5 MG tablet, TAKE 1 TABLET BY MOUTH 1 TO 3 HOURS BEFORE BEDTIME .  Vitamin D, Ergocalciferol, (DRISDOL) 1.25 MG (50000 UT) CAPS capsule, Take 1 capsule (50,000 Units total) by mouth every 7 (seven) days.    Past medical history, social, surgical and family history all reviewed in electronic medical record.  No pertanent information unless stated regarding to the chief complaint.   Review of Systems:  No headache, visual changes, nausea, vomiting, diarrhea, constipation, dizziness, abdominal pain, skin rash, fevers, chills, night sweats, weight loss, swollen lymph nodes, body aches, joint swelling,  chest pain, shortness of breath, mood changes.  Positive muscle aches  Objective  Blood pressure 122/78, pulse 91, height 5\' 8"  (1.727 m), weight 215 lb (97.5 kg), SpO2 97 %.  General: No apparent distress alert and oriented x3 mood and affect normal, dressed appropriately.  HEENT: Pupils equal, extraocular movements intact  Respiratory: Patient's speak in full sentences and does not appear short of breath  Cardiovascular: No lower extremity edema, non tender, no erythema  Skin: Warm dry intact with no signs of infection or rash on extremities or on axial skeleton.  Abdomen: Soft nontender  Neuro: Cranial nerves II through XII are intact, neurovascularly intact in all extremities with 2+ DTRs and 2+ pulses.  Lymph: No lymphadenopathy of posterior or anterior cervical chain or axillae bilaterally.  Gait normal with good balance and coordination.  MSK:  Non tender with full range of motion and good stability and symmetric strength and tone of shoulders, elbows, wrist, hip, knee and ankles  bilaterally.  Back Exam:  Inspection: Severe loss of lordosis Motion: Flexion 45 deg, Extension 45 deg, Side Bending to 35 deg bilaterally,  Rotation to 35 deg bilaterally  SLR laying: Negative  XSLR laying: Negative  Palpable tenderness: Tender to palpation in the paraspinal musculature diffusely mostly in the thoracolumbar juncture. FABER: Positive Faber. Sensory change: Gross sensation intact to all lumbar and sacral dermatomes.  Reflexes: 2+ at both patellar tendons, 2+ at achilles tendons, Babinski's downgoing.  Strength at foot  Plantar-flexion: 5/5 Dorsi-flexion: 5/5 Eversion: 5/5 Inversion: 5/5  Leg strength  Quad: 5/5 Hamstring: 5/5 Hip flexor: 5/5 Hip abductors: 5/5  [id number]; 15 additional minutes spent for Therapeutic exercises as stated in above notes.  This included exercises focusing on stretching, strengthening, with significant focus on eccentric aspects.   Long term goals include an improvement in range of motion, strength, endurance as well as avoiding reinjury. Patient's frequency would include in 1-2 times a day, 3-5 times a week for a duration of 6-12 weeks. Low back exercises that included:  Pelvic tilt/bracing instruction to focus on control of the pelvic girdle and lower abdominal muscles  Glute strengthening exercises, focusing on proper firing of the glutes without engaging the low back muscles Proper stretching techniques for maximum relief for the hamstrings, hip flexors, low back and some rotation where tolerated   Proper technique shown and discussed handout in great detail with ATC.  All questions were discussed and answered.     Impression and Recommendations:     This case required medical decision making of moderate complexity. The above documentation has been reviewed and is accurate and complete Judi Saa, DO       Note: This dictation was prepared with Dragon dictation along with smaller phrase technology. Any transcriptional errors that  result from this process are unintentional.

## 2019-03-10 ENCOUNTER — Ambulatory Visit (INDEPENDENT_AMBULATORY_CARE_PROVIDER_SITE_OTHER)
Admission: RE | Admit: 2019-03-10 | Discharge: 2019-03-10 | Disposition: A | Discharge status: Home / Self Care | Payer: 59 | Source: Ambulatory Visit | Attending: Family Medicine | Admitting: Family Medicine

## 2019-03-10 ENCOUNTER — Encounter: Payer: Self-pay | Admitting: Family Medicine

## 2019-03-10 ENCOUNTER — Ambulatory Visit: Payer: 59 | Admitting: Family Medicine

## 2019-03-10 ENCOUNTER — Other Ambulatory Visit: Payer: Self-pay

## 2019-03-10 VITALS — BP 122/78 | HR 91 | Ht 68.0 in | Wt 215.0 lb

## 2019-03-10 DIAGNOSIS — G8929 Other chronic pain: Secondary | ICD-10-CM | POA: Diagnosis not present

## 2019-03-10 DIAGNOSIS — M545 Low back pain, unspecified: Secondary | ICD-10-CM

## 2019-03-10 MED ORDER — VITAMIN D (ERGOCALCIFEROL) 1.25 MG (50000 UNIT) PO CAPS: 50000.0000 [IU] | ORAL_CAPSULE | ORAL | 0 refills | Status: AC

## 2019-03-10 NOTE — Assessment & Plan Note (Signed)
Low back pain.  Patient is a type I diabetic, discussed with patient in great length.  Patient does have significant loss of lordosis and an abnormal x-ray 14 years ago.  Discussed with patient about posture and ergonomics, differential includes a congenital spinal stenosis based on the loss of lordosis.  Patient has already tried gabapentin in the past with no significant improvement.  Discussed over-the-counter medications that could be beneficial.  Once weekly vitamin D given as well for muscle strength and endurance.  I do believe the patient would be a candidate for manipulation when it is safe but secondary to the coronavirus we are avoiding it.  Follow-up with me again WebEx visit in 3 weeks and then hopefully in the office visit in 2 months

## 2019-03-10 NOTE — Patient Instructions (Addendum)
God to see you  Ice 20 minutes 2 times daily. Usually after activity and before bed. Exercises 3 times a week.  pennsaid pinkie amount topically 2 times daily as needed.  Once weekly vitamin D for next 2 months.  Over the counter get  Turmeric 500mg  daily   Tart cherry extract any dose at nighyt See me on Web-ex in 3 weeks to see how you are doing.  See me again in the office in 8 weeks and hopefuly start the manipulation

## 2019-03-13 ENCOUNTER — Encounter: Payer: Self-pay | Admitting: Family Medicine

## 2019-04-01 NOTE — Progress Notes (Signed)
[name] [address] [place], [place] [address] Phone: [phone] Subjective:    Virtual Visit via Video Note  I connected with Andrew Ruiz on 04/01/19 at 10:30 AM EDT by a video enabled telemedicine application and verified that I am speaking with the correct person using two identifiers.   I discussed the limitations of evaluation and management by telemedicine and the availability of in person appointments. The patient expressed understanding and agreed to proceed.  Patient was in his home residence and I was in the work setting.  Only people on the virtual platform:    I discussed the assessment and treatment plan with the patient. The patient was provided an opportunity to ask questions and all were answered. The patient agreed with the plan and demonstrated an understanding of the instructions.   The patient was advised to call back or seek an in-person evaluation if the symptoms worsen or if the condition fails to improve as anticipated.  I provided 28 minutes of face-to-face time during this encounter.   Judi Saa, DO    CC: Back and neck pain follow-up  [id number]  Andrew Ruiz is a 28 y.o. male coming in with complaint of neck pain follow-up.  Concern for patient having more of a junk congenital spinal stenosis but this was not confirmed with the x-rays.  Patient started with home exercises, medications, icing regimen.  Patient states he has been doing a little bit better.  No side effects to any of the vitamin supplementation.  Still some tightness at night.  Nothing that is stopping him from activities.  Patient does have 2 have his fiance walk on his back nightly.   Patient did have x-rays at last visit of his lumbar spine.  This was independently visualized by me.  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    Past Medical History:  Diagnosis Date  . ADHD (attention deficit hyperactivity disorder)    [facility]  . Allergy   . Diabetes mellitus without complication (HCC)   . History of precordial chest pain   . Pilonidal abscess   . Pilonidal cyst with abscess 08/28/2011  . Ventricular pre-excitation    on ekg card eval  no restrictions   No past surgical history on file. Social History   Socioeconomic History  . Marital status: Single    Spouse name: Not on file  . Number of children: Not on file  . Years of education: Not on file  . Highest education level: Not on file  Occupational History  . Not on file  Social Needs  . Financial resource strain: Not on file  . Food insecurity:    Worry: Not on file    Inability: Not on file  . Transportation needs:    Medical: Not on file    Non-medical: Not on file  Tobacco Use  . Smoking status: Never Smoker  . Smokeless tobacco: Never Used  Substance and Sexual Activity  . Alcohol use: No  . Drug use: No  . Sexual activity: Not on file  Lifestyle  . Physical activity:    Days per week: Not on file    Minutes per session: Not on file  . Stress: Not on file  Relationships  . Social connections:    Talks on phone: Not on file  Gets together: Not on file    Attends religious service: Not on file    Active member of club or organization: Not on file    Attends meetings of clubs or organizations: Not on file    Relationship status: Not on file  Other Topics Concern  . Not on file  Social History Narrative   ** Merged History Encounter **       HH of 1-2   Non smoker some exercise   A  Emergency planning/management officerpolice officer.    Lives alone in an apartment engaged to be married    No Known Allergies Family History  Problem Relation Age of Onset  . ADD / ADHD Brother   . Restless legs syndrome Unknown        grandmother on sinemet?    Current Outpatient Medications (Endocrine & Metabolic):  .  glucagon (GLUCAGON EMERGENCY)  1 MG injection, Inject 1 mg into the vein once as needed. [name].  HUMALOG KWIKPEN 100 UNIT/ML KwikPen, INJECT 0.2-0.3ML (20-30 UNITS TOTAL) INTO THE SKIN 3 TIMES DAILY WITHMEALS .  Insulin Glargine (BASAGLAR KWIKPEN) 100 UNIT/ML SOPN, Inject 0.4 mLs (40 Units total) into the skin daily.  Current Outpatient Medications (Cardiovascular):  .  simvastatin (ZOCOR) 20 MG tablet,   Current Outpatient Medications (Respiratory):  .  cetirizine (ZYRTEC) 10 MG tablet, Take 10 mg by mouth daily.  Current Outpatient Medications (Analgesics):  .  ibuprofen (ADVIL,MOTRIN) 600 MG tablet, Take 1 tablet (600 mg total) by mouth every 6 (six) hours as needed.   Current Outpatient Medications (Other):  [name].  ACCU-CHEK FASTCLIX LANCETS MISC,  .  Diclofenac Sodium (PENNSAID) 2 % SOLN, Place 2 application onto the skin 2 (two) times daily. [name].  glucose blood (CONTOUR NEXT TEST) test strip, TEST 8 TIMES PER DAY, AND LANCETS 8/DAY .  Insulin Infusion Pump Supplies (INSULIN PUMP SYRINGE RESERVOIR) MISC, 1 Device by Does not apply route every 3 (three) days. .  Insulin Infusion Pump Supplies (ULTRAFLEX) MISC, 1 Device by Does not apply route every 3 (three) days. 80 cm tube length, 8 mm catheter length, ref # [id number] .  Insulin Pen Needle 32G X 4 MM MISC, Use to inject insulin 4 times per day. .  Melatonin 5 MG TABS, Take by mouth. PRN Sleep 1-2 tabs .  phentermine 37.5 MG capsule, Take 37.5 mg by mouth every morning. [name].  rOPINIRole (REQUIP) 0.5 MG tablet, TAKE 1 TABLET BY MOUTH 1 TO 3 HOURS BEFORE BEDTIME .  Vitamin D, Ergocalciferol, (DRISDOL) 1.25 MG (50000 UT) CAPS capsule, Take 1 capsule (50,000 Units total) by mouth every 7 (seven) days.    Past medical history, social, surgical and family history all reviewed in electronic medical record.  No pertanent information unless stated regarding to the chief complaint.   Review of Systems:  No headache, visual changes, nausea, vomiting, diarrhea, constipation, dizziness,  abdominal pain, skin rash, fevers, chills, night sweats, weight loss, swollen lymph nodes, body aches, joint swelling,  chest pain, shortness of breath, mood changes.  Positive muscle aches  Objective     General: No apparent distress alert and oriented x3 mood and affect normal, dressed appropriately.      Impression and Recommendations:     . The above documentation has been reviewed and is accurate and complete Judi SaaZachary M , DO       Note: This dictation was prepared with Dragon dictation along with smaller phrase technology. Any transcriptional errors that result from this process are unintentional.

## 2019-04-02 ENCOUNTER — Encounter: Payer: Self-pay | Admitting: Family Medicine

## 2019-04-02 ENCOUNTER — Ambulatory Visit (INDEPENDENT_AMBULATORY_CARE_PROVIDER_SITE_OTHER): Payer: 59 | Admitting: Family Medicine

## 2019-04-02 DIAGNOSIS — M545 Low back pain: Secondary | ICD-10-CM | POA: Diagnosis not present

## 2019-04-02 DIAGNOSIS — G8929 Other chronic pain: Secondary | ICD-10-CM | POA: Diagnosis not present

## 2019-04-02 MED ORDER — GABAPENTIN 100 MG PO CAPS: 200.0000 mg | ORAL_CAPSULE | Freq: Every day | ORAL | 3 refills | Status: AC

## 2019-04-02 NOTE — Assessment & Plan Note (Addendum)
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  Patient has been doing relatively well with conservative therapy at this time.  I do think manipulation would be beneficial but would like to wait until the current outbreak is decreasing from the peak.  We will see patient in the office in 4 weeks we did after discussing the nighttime pain syndrome gabapentin that I think will be beneficial as well.  Patient is in agreement with the plan.

## 2019-04-08 ENCOUNTER — Telehealth: Payer: Self-pay | Admitting: *Deleted

## 2019-04-08 NOTE — Telephone Encounter (Signed)
Copied from CRM [id number]. Topic: Referral - Request for Referral >> Apr 08, 2019  2:11 PM Rica Koyanagi, Barbee Cough wrote: Has patient seen PCP for this complaint? Yes.   *If NO, is insurance requiring patient see PCP for this issue before PCP can refer them? Referral for which specialty: endo Preferred provider/office: Dr Carlena Sax [facility]  fax is [phone] Reason for referral: diabetes type 1

## 2019-04-08 NOTE — Telephone Encounter (Addendum)
Please complete referral to  Endo specialist requested    And inform patient that has been placed  Dx  Type 1DM

## 2019-04-09 ENCOUNTER — Other Ambulatory Visit: Payer: Self-pay

## 2019-04-09 DIAGNOSIS — E109 Type 1 diabetes mellitus without complications: Secondary | ICD-10-CM

## 2019-04-09 NOTE — Telephone Encounter (Signed)
lvm letting pt know referral has been placed

## 2019-04-15 ENCOUNTER — Telehealth: Payer: Self-pay | Admitting: *Deleted

## 2019-04-15 ENCOUNTER — Other Ambulatory Visit: Payer: Self-pay

## 2019-04-15 NOTE — Telephone Encounter (Signed)
Copied from CRM [id number]. Topic: Referral - Question >> Apr 15, 2019 10:39 AM Jay Schlichter wrote: Reason for CRM: pt wants to go to [facility] for his endo referral. Please fax referral to [phone]

## 2019-04-18 NOTE — Telephone Encounter (Addendum)
nate from  [facility] would like the referral fax to his attention

## 2019-04-18 NOTE — Telephone Encounter (Signed)
Please update referral and send information requested.

## 2019-04-21 ENCOUNTER — Telehealth: Payer: Self-pay | Admitting: Internal Medicine

## 2019-04-21 NOTE — Telephone Encounter (Signed)
Copied from CRM [id number]. Topic: Referral - Request for Referral >> Apr 21, 2019  1:25 PM Jens Som A wrote: CRM for notification. See Telephone encounter for: 04/21/19.  Has patient seen PCP for this complaint? Yes  *If NO, is insurance requiring patient see PCP for this issue before PCP can refer them? NO Referral for which specialty: Endocrinology Preferred provider/office: Dr. Darcella Gasman [facility] [address] [place]  Reason for referral: Diabetes

## 2019-04-23 NOTE — Telephone Encounter (Signed)
Faxed referral notes to [phone] Attn Mate spoke with him  °Called pt to inform this was done - will call pt to schedule directly  °A. Melissa Solum, MD °Endocrinologist in [place], [place] °Address: [address], [place], [place] [address] °Phone: [phone] °

## 2019-04-23 NOTE — Telephone Encounter (Signed)
Patient called and said that [facility] called him this morning and they said that they still have not received his referral from the office. He stated that [facility] would like a call back from office to speak about the referral. Also patient would like a call back to know that it has been send. Please call patient back, thanks.

## 2019-04-23 NOTE — Telephone Encounter (Signed)
Andrew Ruiz, I am so sorry that the referral got  sidetracked. Our  referral coordinator is calling and working on correct referral   Right now.    She should contact you about the status of the referral.    Do I need to  Help get you a refill of  Your insulin in the interim ?  WP

## 2019-04-23 NOTE — Telephone Encounter (Signed)
I have been in touch with the patient through mychart. Nothing further needed.

## 2019-04-23 NOTE — Telephone Encounter (Signed)
Faxed referral notes to [phone] Attn Mate spoke with him  Called pt to inform this was done - will call pt to schedule directly  A. Wendall Mola, MD Endocrin[place] Address: [address] [place], [place], [place] [address] Phone: [phone]

## 2019-04-27 NOTE — Progress Notes (Signed)
[name] [address] [place], [place] [address] Phone: [phone] Subjective:   Bruce Donath, am serving as a scribe for Dr. Antoine Primas.  I'm seeing this patient by the request  of:    CC: Back pain follow-up  [id number]   04/02/2019: Chronic low back pain seems to be more sacroiliac in nature.  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  Patient has been doing relatively well with conservative therapy at this time.  I do think manipulation would be beneficial but would like to wait until the current outbreak is decreasing from the peak.  We will see patient in the office in 4 weeks we did after discussing the nighttime pain syndrome gabapentin that I think will be beneficial as well.  Patient is in agreement with the plan.  Update 04/28/2019: Andrew Ruiz is a 28 y.o. male coming in with complaint of back and right ankle pain. Suffered two inversion sprains on Mother's Day. Pain over lateral malleolus. Improving over time. Is wearing ankle sleeve. Pain was bad enough where he had to call out of work.   Back pain has improved since last visit. Has only had pain once when he was at a dog park and sat on a wooden bench for 1 hour.     Past Medical History:  Diagnosis Date  . ADHD (attention deficit hyperactivity disorder)    [facility] no ld  . Allergy   . Diabetes mellitus without complication (HCC)   . History of precordial chest pain   . Pilonidal abscess   . Pilonidal cyst with abscess 08/28/2011  . Ventricular pre-excitation    on ekg card eval  no restrictions   No past surgical history on file. Social History   Socioeconomic History  . Marital status: Single    Spouse name: Not on file  . Number of children: Not on file  . Years of education: Not on file  . Highest education level: Not on file  Occupational  History  . Not on file  Social Needs  . Financial resource strain: Not on file  . Food insecurity:    Worry: Not on file    Inability: Not on file  . Transportation needs:    Medical: Not on file    Non-medical: Not on file  Tobacco Use  . Smoking status: Never Smoker  . Smokeless tobacco: Never Used  Substance and Sexual Activity  . Alcohol use: No  . Drug use: No  . Sexual activity: Not on file  Lifestyle  . Physical activity:    Days per week: Not on file    Minutes per session: Not on file  . Stress: Not on file  Relationships  . Social connections:    Talks on phone: Not on file    Gets together: Not on file    Attends religious service: Not on file    Active member of club or organization: Not on file    Attends meetings of clubs or organizations: Not on file    Relationship status: Not on file  Other Topics Concern  . Not on file  Social History Narrative   ** Merged History Encounter **       HH of 1-2   Non smoker some exercise   A  Emergency planning/management officer.    Lives alone  in an apartment engaged to be married    No Known Allergies Family History  Problem Relation Age of Onset  . ADD / ADHD Brother   . Restless legs syndrome Unknown        grandmother on sinemet?    Current Outpatient Medications (Endocrine & Metabolic):  .  glucagon (GLUCAGON EMERGENCY) 1 MG injection, Inject 1 mg into the vein once as needed. [name]  HUMALOG KWIKPEN 100 UNIT/ML KwikPen, INJECT 0.2-0.3ML (20-30 UNITS TOTAL) INTO THE SKIN 3 TIMES DAILY WITHMEALS .  Insulin Glargine (BASAGLAR KWIKPEN) 100 UNIT/ML SOPN, Inject 0.4 mLs (40 Units total) into the skin daily.  Current Outpatient Medications (Cardiovascular):  .  simvastatin (ZOCOR) 20 MG tablet,   Current Outpatient Medications (Respiratory):  .  cetirizine (ZYRTEC) 10 MG tablet, Take 10 mg by mouth daily.  Current Outpatient Medications (Analgesics):  .  ibuprofen (ADVIL,MOTRIN) 600 MG tablet, Take 1 tablet (600 mg total) by mouth  every 6 (six) hours as needed.   Current Outpatient Medications (Other):  [name]  ACCU-CHEK FASTCLIX LANCETS MISC,  .  Diclofenac Sodium (PENNSAID) 2 % SOLN, Place 2 application onto the skin 2 (two) times daily. [name]  gabapentin (NEURONTIN) 100 MG capsule, Take 2 capsules (200 mg total) by mouth at bedtime. [name]  glucose blood (CONTOUR NEXT TEST) test strip, TEST 8 TIMES PER DAY, AND LANCETS 8/DAY .  Insulin Infusion Pump Supplies (INSULIN PUMP SYRINGE RESERVOIR) MISC, 1 Device by Does not apply route every 3 (three) days. .  Insulin Infusion Pump Supplies (ULTRAFLEX) MISC, 1 Device by Does not apply route every 3 (three) days. 80 cm tube length, 8 mm catheter length, ref # [id number] .  Insulin Pen Needle 32G X 4 MM MISC, Use to inject insulin 4 times per day. .  Melatonin 5 MG TABS, Take by mouth. PRN Sleep 1-2 tabs .  phentermine 37.5 MG capsule, Take 37.5 mg by mouth every morning. [name]  rOPINIRole (REQUIP) 0.5 MG tablet, TAKE 1 TABLET BY MOUTH 1 TO 3 HOURS BEFORE BEDTIME .  Vitamin D, Ergocalciferol, (DRISDOL) 1.25 MG (50000 UT) CAPS capsule, Take 1 capsule (50,000 Units total) by mouth every 7 (seven) days. .  Diclofenac Sodium 2 % SOLN, Place 2 g onto the skin 2 (two) times daily.    Past medical history, social, surgical and family history all reviewed in electronic medical record.  No pertanent information unless stated regarding to the chief complaint.   Review of Systems:  No headache, visual changes, nausea, vomiting, diarrhea, constipation, dizziness, abdominal pain, skin rash, fevers, chills, night sweats, weight loss, swollen lymph nodes, body aches, joint swelling,  chest pain, shortness of breath, mood changes. Positive muscle aches   Objective  Blood pressure 118/80, pulse 93, height 5\' 8"  (1.727 m), weight 221 lb (100.2 kg), SpO2 97 %.     General: No apparent distress alert and oriented x3 mood and affect normal, dressed appropriately.  HEENT: Pupils equal, extraocular movements  intact  Respiratory: Patient's speak in full sentences and does not appear short of breath  Cardiovascular: No lower extremity edema, non tender, no erythema  Skin: Warm dry intact with no signs of infection or rash on extremities or on axial skeleton.  Abdomen: Soft nontender  Neuro: Cranial nerves II through XII are intact, neurovascularly intact in all extremities with 2+ DTRs and 2+ pulses.  Lymph: No lymphadenopathy of posterior or anterior cervical chain or axillae bilaterally.  Gait antalgic  MSK:  Non tender with full range  of motion and good stability and symmetric strength and tone of shoulders, elbows, wrist, hip, knee bilaterally.    Ankle: Right Trace effusion  Range of motion is full in all directions. Strength is 5/5 in all directions. Stable lateral and medial ligaments; squeeze test and kleiger test unremarkable; Talar dome mild tttp; No pain at base of 5th MT; No tenderness over cuboid; No tenderness over N spot or navicular prominence No tenderness on posterior aspects of lateral and medial malleolus No sign of peroneal tendon subluxations or tenderness to palpation Negative tarsal tunnel tinel's Able to walk 4 steps.    Contralateral ankle unremarkable  Back Exam:  Inspection: loss of lordosis  Motion: Flexion 40 deg, Extension 25 deg, Side Bending to 35 deg bilaterally,  Rotation to 35 deg bilaterally  SLR laying: Negative  XSLR laying: Negative  Palpable tenderness: Tender to palpation paraspinal musculature lumbar spine. FABER: tight right . Sensory change: Gross sensation intact to all lumbar and sacral dermatomes.  Reflexes: 2+ at both patellar tendons, 2+ at achilles tendons, Babinski's downgoing.  Strength at foot  Plantar-flexion: 5/5 Dorsi-flexion: 5/5 Eversion: 5/5 Inversion: 5/5  Leg strength  Quad: 5/5 Hamstring: 5/5 Hip flexor: 5/5 Hip abductors: 4/5 symmetric    Osteopathic findings  C2 flexed rotated and side bent right C6 flexed rotated  and side bent left T3 extended rotated and side bent right inhaled third rib T9 extended rotated and side bent left L2 flexed rotated and side bent right Sacrum right on right    Impression and Recommendations:     This case required medical decision making of moderate complexity. The above documentation has been reviewed and is accurate and complete Andrew SaaZachary M Jayveon Convey, DO       Note: This dictation was prepared with Dragon dictation along with smaller phrase technology. Any transcriptional errors that result from this process are unintentional.

## 2019-04-28 ENCOUNTER — Encounter: Payer: Self-pay | Admitting: Family Medicine

## 2019-04-28 ENCOUNTER — Ambulatory Visit: Payer: 59 | Admitting: Family Medicine

## 2019-04-28 ENCOUNTER — Ambulatory Visit: Payer: Self-pay

## 2019-04-28 ENCOUNTER — Other Ambulatory Visit: Payer: Self-pay

## 2019-04-28 VITALS — BP 118/80 | HR 93 | Ht 68.0 in | Wt 221.0 lb

## 2019-04-28 DIAGNOSIS — G8929 Other chronic pain: Secondary | ICD-10-CM

## 2019-04-28 DIAGNOSIS — M25571 Pain in right ankle and joints of right foot: Secondary | ICD-10-CM

## 2019-04-28 DIAGNOSIS — M545 Low back pain, unspecified: Secondary | ICD-10-CM

## 2019-04-28 DIAGNOSIS — M999 Biomechanical lesion, unspecified: Secondary | ICD-10-CM | POA: Diagnosis not present

## 2019-04-28 DIAGNOSIS — Z9641 Presence of insulin pump (external) (internal): Secondary | ICD-10-CM | POA: Diagnosis not present

## 2019-04-28 DIAGNOSIS — S93401A Sprain of unspecified ligament of right ankle, initial encounter: Secondary | ICD-10-CM | POA: Diagnosis not present

## 2019-04-28 DIAGNOSIS — E782 Mixed hyperlipidemia: Secondary | ICD-10-CM | POA: Diagnosis not present

## 2019-04-28 DIAGNOSIS — E109 Type 1 diabetes mellitus without complications: Secondary | ICD-10-CM | POA: Diagnosis not present

## 2019-04-28 MED ORDER — DICLOFENAC SODIUM 2 % TD SOLN: 2.0000 g | Freq: Two times a day (BID) | TRANSDERMAL | 3 refills | Status: AC

## 2019-04-28 NOTE — Assessment & Plan Note (Signed)
Decision today to treat with OMT was based on Physical Exam  After verbal consent patient was treated with HVLA, ME, FPR techniques in cervical, thoracic, rib, lumbar and sacral areas  Patient tolerated the procedure well with improvement in symptoms  Patient given exercises, stretches and lifestyle modifications  See medications in patient instructions if given  Patient will follow up in 4-6 weeks 

## 2019-04-28 NOTE — Patient Instructions (Signed)
Good to see you  Ice is your friend Stay active Exercises 3 times a week.  [email or web address] size medium  Duexis 3 times a day for 3 days  Once you have it pennsaid pinkie amount topically 2 times daily as needed.  Focus more on biking and elliptical and not as much of high impact exercises  See me again in 6 weeks

## 2019-04-28 NOTE — Assessment & Plan Note (Signed)
Moderate ankle sprain.  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  We discussed the possibility of bracing, compression, home exercises, icing and topical anti-inflammatories.  Avoid high impact exercises.  Follow-up again in 4 to 6 weeks

## 2019-04-28 NOTE — Assessment & Plan Note (Signed)
Multifactorial.  Discussed icing regimen as well as ultrasound.  Discussed with athletic trainer.  I do believe that this is more muscle imbalances.  Patient is to increase activity slowly.  Attempted osteopathic manipulation today.  Follow-up again in 4 to 6 weeks

## 2019-05-01 ENCOUNTER — Encounter: Payer: Self-pay | Admitting: Internal Medicine

## 2019-06-05 ENCOUNTER — Ambulatory Visit (INDEPENDENT_AMBULATORY_CARE_PROVIDER_SITE_OTHER)
Admission: RE | Admit: 2019-06-05 | Discharge: 2019-06-05 | Disposition: A | Discharge status: Home / Self Care | Payer: 59 | Source: Ambulatory Visit | Attending: Family Medicine | Admitting: Family Medicine

## 2019-06-05 ENCOUNTER — Ambulatory Visit: Payer: 59 | Admitting: Family Medicine

## 2019-06-05 ENCOUNTER — Other Ambulatory Visit: Payer: Self-pay

## 2019-06-05 ENCOUNTER — Encounter: Payer: Self-pay | Admitting: Family Medicine

## 2019-06-05 VITALS — BP 92/62 | HR 83 | Ht 68.0 in | Wt 221.0 lb

## 2019-06-05 DIAGNOSIS — M25571 Pain in right ankle and joints of right foot: Secondary | ICD-10-CM

## 2019-06-05 DIAGNOSIS — G8929 Other chronic pain: Secondary | ICD-10-CM | POA: Diagnosis not present

## 2019-06-05 DIAGNOSIS — M545 Low back pain: Secondary | ICD-10-CM

## 2019-06-05 DIAGNOSIS — M999 Biomechanical lesion, unspecified: Secondary | ICD-10-CM

## 2019-06-05 DIAGNOSIS — S93401D Sprain of unspecified ligament of right ankle, subsequent encounter: Secondary | ICD-10-CM | POA: Diagnosis not present

## 2019-06-05 MED ORDER — VITAMIN D (ERGOCALCIFEROL) 1.25 MG (50000 UNIT) PO CAPS: 50000.0000 [IU] | ORAL_CAPSULE | ORAL | 0 refills | Status: AC

## 2019-06-05 NOTE — Assessment & Plan Note (Signed)
Recurrent sprain.  X-rays ordered today.  Ultrasound was fairly nonspecific.  Discussed icing regimen and home exercises.  Discussed potential other bracing.  I do believe patient should do well with conservative therapy though.  Follow-up in 4 to 6 weeks

## 2019-06-05 NOTE — Assessment & Plan Note (Signed)
Decision today to treat with OMT was based on Physical Exam  After verbal consent patient was treated with HVLA, ME, FPR techniques in cervical, thoracic, lumbar and sacral areas  Patient tolerated the procedure well with improvement in symptoms  Patient given exercises, stretches and lifestyle modifications  See medications in patient instructions if given  Patient will follow up in 4-8 weeks 

## 2019-06-05 NOTE — Progress Notes (Signed)
[facility] [address] [place], [place] [address] Phone: [phone] Subjective:   Andrew Ruiz, am serving as a scribe for Dr. Hulan Saas.   CC: Right ankle, back pain follow-up  QIO:[id number]   04/28/2019: Multifactorial.  Discussed icing regimen as well as ultrasound.  Discussed with athletic trainer.  I do believe that this is more muscle imbalances.  Patient is to increase activity slowly.  Attempted osteopathic manipulation today.  Follow-up again in 4 to 6 weeks  Moderate ankle sprain.  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  We discussed the possibility of bracing, compression, home exercises, icing and topical anti-inflammatories.  Avoid high impact exercises.  Follow-up again in 4 to 6 weeks  Update 06/05/2019: Andrew Ruiz is a 28 y.o. male coming in with complaint of back and ankle pain. Patient states that his back was doing ok. Did do some flying on Monday and woke up on Tuesday with right SI joint pain. Did have a popping sensation when having a bowel movement.   Feels like his ankle is not improving. Is using body helix compression brace. Feels like he is constantly injuring his ankle which is causing swelling. Jumped up in a pool to get a ball and felt pain with landing.   Past Medical History:  Diagnosis Date  . ADHD (attention deficit hyperactivity disorder)    [facility] Ruiz ld  . Allergy   . Diabetes mellitus without complication ([facility])   . History of precordial chest pain   . Pilonidal abscess   . Pilonidal cyst with abscess 08/28/2011  . Ventricular pre-excitation    on ekg card eval  Ruiz restrictions   Ruiz past surgical history on file. Social History   Socioeconomic History  . Marital status: Single    Spouse name: Not on file  . Number of children: Not on file  . Years of education: Not on file  . Highest education level: Not on file  Occupational History  . Not on file  Social Needs  .  Financial resource strain: Not on file  . Food insecurity    Worry: Not on file    Inability: Not on file  . Transportation needs    Medical: Not on file    Non-medical: Not on file  Tobacco Use  . Smoking status: Never Smoker  . Smokeless tobacco: Never Used  Substance and Sexual Activity  . Alcohol use: Ruiz  . Drug use: Ruiz  . Sexual activity: Not on file  Lifestyle  . Physical activity    Days per week: Not on file    Minutes per session: Not on file  . Stress: Not on file  Relationships  . Social Herbalist on phone: Not on file    Gets together: Not on file    Attends religious service: Not on file    Active member of club or organization: Not on file    Attends meetings of clubs or organizations: Not on file    Relationship status: Not on file  Other Topics Concern  . Not on file  Social History Narrative   ** Merged History Encounter **       HH of 1-2   Non smoker some exercise   A  Engineer, structural.    Lives alone in an apartment engaged to be married    Ruiz Known Allergies Family History  Problem Relation Age of Onset  .  ADD / ADHD Brother   . Restless legs syndrome Unknown        grandmother on sinemet?    Current Outpatient Medications (Endocrine & Metabolic):  .  glucagon (GLUCAGON EMERGENCY) 1 MG injection, Inject 1 mg into the vein once as needed. [name].  HUMALOG KWIKPEN 100 UNIT/ML KwikPen, INJECT 0.2-0.3ML (20-30 UNITS TOTAL) INTO THE SKIN 3 TIMES DAILY WITHMEALS .  Insulin Glargine (BASAGLAR KWIKPEN) 100 UNIT/ML SOPN, Inject 0.4 mLs (40 Units total) into the skin daily.  Current Outpatient Medications (Cardiovascular):  .  simvastatin (ZOCOR) 20 MG tablet,   Current Outpatient Medications (Respiratory):  .  cetirizine (ZYRTEC) 10 MG tablet, Take 10 mg by mouth daily.  Current Outpatient Medications (Analgesics):  .  ibuprofen (ADVIL,MOTRIN) 600 MG tablet, Take 1 tablet (600 mg total) by mouth every 6 (six) hours as needed.   Current  Outpatient Medications (Other):  [name].  ACCU-CHEK FASTCLIX LANCETS MISC,  .  Diclofenac Sodium (PENNSAID) 2 % SOLN, Place 2 application onto the skin 2 (two) times daily. .  Diclofenac Sodium 2 % SOLN, Place 2 g onto the skin 2 (two) times daily. [name].  gabapentin (NEURONTIN) 100 MG capsule, Take 2 capsules (200 mg total) by mouth at bedtime. [name].  glucose blood (CONTOUR NEXT TEST) test strip, TEST 8 TIMES PER DAY, AND LANCETS 8/DAY .  Insulin Infusion Pump Supplies (INSULIN PUMP SYRINGE RESERVOIR) MISC, 1 Device by Does not apply route every 3 (three) days. .  Insulin Infusion Pump Supplies (ULTRAFLEX) MISC, 1 Device by Does not apply route every 3 (three) days. 80 cm tube length, 8 mm catheter length, ref # [id number] .  Insulin Pen Needle 32G X 4 MM MISC, Use to inject insulin 4 times per day. .  Melatonin 5 MG TABS, Take by mouth. PRN Sleep 1-2 tabs .  phentermine 37.5 MG capsule, Take 37.5 mg by mouth every morning. [name].  rOPINIRole (REQUIP) 0.5 MG tablet, TAKE 1 TABLET BY MOUTH 1 TO 3 HOURS BEFORE BEDTIME .  Vitamin D, Ergocalciferol, (DRISDOL) 1.25 MG (50000 UT) CAPS capsule, Take 1 capsule (50,000 Units total) by mouth every 7 (seven) days. .  Vitamin D, Ergocalciferol, (DRISDOL) 1.25 MG (50000 UT) CAPS capsule, Take 1 capsule (50,000 Units total) by mouth every 7 (seven) days.    Past medical history, social, surgical and family history all reviewed in electronic medical record.  Ruiz pertanent information unless stated regarding to the chief complaint.   Review of Systems:  Ruiz headache, visual changes, nausea, vomiting, diarrhea, constipation, dizziness, abdominal pain, skin rash, fevers, chills, night sweats, weight loss, swollen lymph nodes, body aches, joint swelling, muscle aches, chest pain, shortness of breath, mood changes.   Objective  Blood pressure 92/62, pulse 83, height 5\' 8"  (1.727 m), weight 221 lb (100.2 kg), SpO2 97 %.    General: Ruiz apparent distress alert and oriented x3 mood  and affect normal, dressed appropriately.  HEENT: Pupils equal, extraocular movements intact  Respiratory: Patient's speak in full sentences and does not appear short of breath  Cardiovascular: Ruiz lower extremity edema, non tender, Ruiz erythema  Skin: Warm dry intact with Ruiz signs of infection or rash on extremities or on axial skeleton.  Abdomen: Soft nontender  Neuro: Cranial nerves II through XII are intact, neurovascularly intact in all extremities with 2+ DTRs and 2+ pulses.  Lymph: Ruiz lymphadenopathy of posterior or anterior cervical chain or axillae bilaterally.  Gait normal with good balance and coordination.  MSK:  Non tender  with full range of motion and good stability and symmetric strength and tone of shoulders, elbows, wrist, hip, knee and bilaterally.  Ankle: Right Ruiz visible erythema or swelling. Range of motion is full in all directions. Strength is 5/5 in all directions. Stable lateral and medial ligaments; squeeze test and kleiger test unremarkable; Talar dome nontender; Ruiz pain at base of 5th MT; Ruiz tenderness over cuboid; Ruiz tenderness over N spot or navicular prominence Mild tenderness over the ATFL Able to walk 4 steps Contralateral ankle unremarkable  Back Exam:  Inspection:  mild loss of lordosis  Motion: Flexion 45 deg, Extension 25 deg, Side Bending to 35 deg bilaterally,  Rotation to 35 deg bilaterally  SLR laying: Negative  XSLR laying: Negative  Palpable tenderness: Tender to palpation the paraspinal musculature lumbar spine right greater than left. FABER: Positive Faber. Sensory change: Gross sensation intact to all lumbar and sacral dermatomes.  Reflexes: 2+ at both patellar tendons, 2+ at achilles tendons, Babinski's downgoing.  Strength at foot  Plantar-flexion: 5/5 Dorsi-flexion: 5/5 Eversion: 5/5 Inversion: 5/5  Leg strength     Limited musculoskeletal ultrasound was performed and interpreted by Judi SaaZachary M Ameliana Brashear  Limited ultrasound shows some  mild hypoechoic changes around the ATFL but Ruiz true gapping on dynamic testing.  Ruiz abnormality of the talar dome.  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 The above documentation has been reviewed and is accurate and complete Judi SaaZachary M Romanda Turrubiates, DO       Note: This dictation was prepared with Dragon dictation along with smaller phrase technology. Any transcriptional errors that result from this process are unintentional.

## 2019-06-05 NOTE — Assessment & Plan Note (Signed)
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  Patient does respond fairly well to osteopathic manipulation and should do well.  We do not feel that we need any advanced imaging at this point because it would not change medical management.  Patient declined any type of formal physical therapy.  Follow-up again in 4-8

## 2019-06-05 NOTE — Patient Instructions (Addendum)
Vitamin D once a week Xray downstairs Aircast daily for one week and then with activity See me again in 3-4 weeks

## 2019-06-24 ENCOUNTER — Other Ambulatory Visit: Payer: Self-pay

## 2019-06-24 ENCOUNTER — Ambulatory Visit (INDEPENDENT_AMBULATORY_CARE_PROVIDER_SITE_OTHER): Payer: 59 | Admitting: Family Medicine

## 2019-06-24 ENCOUNTER — Encounter: Payer: Self-pay | Admitting: Family Medicine

## 2019-06-24 VITALS — BP 110/70 | HR 80 | Ht 68.0 in | Wt 221.0 lb

## 2019-06-24 DIAGNOSIS — M545 Low back pain, unspecified: Secondary | ICD-10-CM

## 2019-06-24 DIAGNOSIS — G8929 Other chronic pain: Secondary | ICD-10-CM | POA: Diagnosis not present

## 2019-06-24 DIAGNOSIS — M999 Biomechanical lesion, unspecified: Secondary | ICD-10-CM

## 2019-06-24 NOTE — Patient Instructions (Signed)
One knee down one knee up tilt pelvis forward.  Hands overhead then rotate to upper leg.  Hold 10 seconds, relax, repeat and do other side as well.  Very important when after being in a flexed position for a long amount of time.  When on your back keep hip and knee at 90 degrees and I would pull toward opposite shoulder Keep it up otherwise  See em again in 5-6 weeks

## 2019-06-24 NOTE — Assessment & Plan Note (Signed)
Decision today to treat with OMT was based on Physical Exam  After verbal consent patient was treated with HVLA, ME, FPR techniques in cervical, thoracic, lumbar and sacral areas  Patient tolerated the procedure well with improvement in symptoms  Patient given exercises, stretches and lifestyle modifications  See medications in patient instructions if given  Patient will follow up in 8 weeks 

## 2019-06-24 NOTE — Progress Notes (Signed)
[name] [place], [place] [address] Phone: [phone] Subjective:   [name], am serving as a scribe for Dr. Hulan Saas.   CC: Neck pain and ankle pain follow-up  [id number]   06/05/2019: Recurrent sprain.  X-rays ordered today.  Ultrasound was fairly nonspecific.  Discussed icing regimen and home exercises.  Discussed potential other bracing.  I do believe patient should do well with conservative therapy though.  Follow-up in 4 to 6 weeks  Update 06/24/2019: Andrew Ruiz is a 28 y.o. male coming in with complaint of right ankle pain. States that his ankle pain has improved. Has not sprained it again. Intermittent pain over the lateral malleolus. Did try cycling this week and this did not cause pain.   Continues to have lower back pain which he knows he needs to work on with core strengthening.      Past Medical History:  Diagnosis Date  . ADHD (attention deficit hyperactivity disorder)    [facility]  . Allergy   . Diabetes mellitus without complication ([place])   . History of precordial chest pain   . Pilonidal abscess   . Pilonidal cyst with abscess 08/28/2011  . Ventricular pre-excitation    on ekg card eval  no restrictions   No past surgical history on file. Social History   Socioeconomic History  . Marital status: Single    Spouse name: Not on file  . Number of children: Not on file  . Years of education: Not on file  . Highest education level: Not on file  Occupational History  . Not on file  Social Needs  . Financial resource strain: Not on file  . Food insecurity    Worry: Not on file    Inability: Not on file  . Transportation needs    Medical: Not on file    Non-medical: Not on file  Tobacco Use  . Smoking status: Never Smoker  . Smokeless tobacco: Never Used  Substance and Sexual Activity  . Alcohol use: No  . Drug use: No  . Sexual activity: Not on file  Lifestyle   . Physical activity    Days per week: Not on file    Minutes per session: Not on file  . Stress: Not on file  Relationships  . Social Herbalist on phone: Not on file    Gets together: Not on file    Attends religious service: Not on file    Active member of club or organization: Not on file    Attends meetings of clubs or organizations: Not on file    Relationship status: Not on file  Other Topics Concern  . 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 ADD / ADHD Brother   . Restless legs syndrome Unknown        grandmother on sinemet?    Current Outpatient Medications (Endocrine & Metabolic):  .  glucagon (GLUCAGON EMERGENCY) 1 MG injection, Inject 1 mg into the vein once as needed. [name]  HUMALOG KWIKPEN 100 UNIT/ML KwikPen, INJECT 0.2-0.3ML (20-30 UNITS TOTAL) INTO THE SKIN 3  TIMES DAILY WITHMEALS .  Insulin Glargine (BASAGLAR KWIKPEN) 100 UNIT/ML SOPN, Inject 0.4 mLs (40 Units total) into the skin daily.  Current Outpatient Medications (Cardiovascular):  .  simvastatin (ZOCOR) 20 MG tablet,   Current Outpatient Medications (Respiratory):  .  cetirizine (ZYRTEC) 10 MG tablet, Take 10 mg by mouth daily.  Current Outpatient Medications (Analgesics):  .  ibuprofen (ADVIL,MOTRIN) 600 MG tablet, Take 1 tablet (600 mg total) by mouth every 6 (six) hours as needed.   Current Outpatient Medications (Other):  .  ACCU-CHEK FASTCLIX LANCETS MISC,  .  Diclofenac Sodium (PENNSAID)[name] 2 % SOLN, Place 2 application onto the skin 2 (two) times daily. .  Diclofenac Sodium 2 % SOLN, Place 2 g onto the skin 2 (two) times daily. [name].  gabapentin (NEURONTIN) 100 MG capsule, Take 2 capsules (200 mg total) by mouth at bedtime. [name].  glucose blood (CONTOUR NEXT TEST) test strip,  TEST 8 TIMES PER DAY, AND LANCETS 8/DAY .  Insulin Infusion Pump Supplies (INSULIN PUMP SYRINGE RESERVOIR) MISC, 1 Device by Does not apply route every 3 (three) days. .  Insulin Infusion Pump Supplies (ULTRAFLEX) MISC, 1 Device by Does not apply route every 3 (three) days. 80 cm tube length, 8 mm catheter length, ref # [id number] .  Insulin Pen Needle 32G X 4 MM MISC, Use to inject insulin 4 times per day. .  Melatonin 5 MG TABS, Take by mouth. PRN Sleep 1-2 tabs .  phentermine 37.5 MG capsule, Take 37.5 mg by mouth every morning. [name].  rOPINIRole (REQUIP) 0.5 MG tablet, TAKE 1 TABLET BY MOUTH 1 TO 3 HOURS BEFORE BEDTIME .  Vitamin D, Ergocalciferol, (DRISDOL) 1.25 MG (50000 UT) CAPS capsule, Take 1 capsule (50,000 Units total) by mouth every 7 (seven) days. .  Vitamin D, Ergocalciferol, (DRISDOL) 1.25 MG (50000 UT) CAPS capsule, Take 1 capsule (50,000 Units total) by mouth every 7 (seven) days.    Past medical history, social, surgical and family history all reviewed in electronic medical record.  No pertanent information unless stated regarding to the chief complaint.   Review of Systems:  No headache, visual changes, nausea, vomiting, diarrhea, constipation, dizziness, abdominal pain, skin rash, fevers, chills, night sweats, weight loss, swollen lymph nodes, body aches, joint swelling, chest pain, shortness of breath, mood changes.  Positive muscle aches  Objective  Blood pressure 110/70, pulse 80, height 5\' 8"  (1.727 m), weight 221 lb (100.2 kg), SpO2 97 %.    General: No apparent distress alert and oriented x3 mood and affect normal, dressed appropriately.  HEENT: Pupils equal, extraocular movements intact  Respiratory: Patient's speak in full sentences and does not appear short of breath  Cardiovascular: No lower extremity edema, non tender, no erythema  Skin: Warm dry intact with no signs of infection or rash on extremities or on axial skeleton.  Abdomen: Soft nontender  Neuro:  Cranial nerves II through XII are intact, neurovascularly intact in all extremities with 2+ DTRs and 2+ pulses.  Lymph: No lymphadenopathy of posterior or anterior cervical chain or axillae bilaterally.  Gait normal with good balance and coordination.  MSK:  Non tender with full range of motion and good stability and symmetric strength and tone of shoulders, elbows, wrist, hip, knee  bilaterally.  Ankle right ankle No visible erythema or swelling. Range of motion is full in all directions. Strength is 5/5 in all directions. Stable lateral and medial ligaments; squeeze test and kleiger test unremarkable; Talar dome nontender; No pain at base of  5th MT; No tenderness over cuboid; No tenderness over N spot or navicular prominence No tenderness on posterior aspects of lateral and medial malleolus No sign of peroneal tendon subluxations or tenderness to palpation Negative tarsal tunnel tinel's Able to walk 4 steps.  Neck: Inspection mild loss of lordosis. No palpable stepoffs. Negative Spurling's maneuver. Full neck range of motion Grip strength and sensation normal in bilateral hands Strength good C4 to T1 distribution No sensory change to C4 to T1 Negative Hoffman sign bilaterally Reflexes normal  Back Exam:  Inspection: Loss of lordosis Motion: Flexion 35 deg, Extension 25 deg, Side Bending to 45 deg bilaterally,  Rotation to 45 deg bilaterally  SLR laying: Negative  XSLR laying: Negative  Palpable tenderness: None. FABER: negative. Sensory change: Gross sensation intact to all lumbar and sacral dermatomes.  Reflexes: 2+ at both patellar tendons, 2+ at achilles tendons, Babinski's downgoing.  Strength at foot  Plantar-flexion: 5/5 Dorsi-flexion: 5/5 Eversion: 5/5 Inversion: 5/5  Leg strength  Quad: 5/5 Hamstring: 5/5 Hip flexor: 5/5 Hip abductors: 5/5  Gait unremarkable.  Osteopathic findings C2 flexed rotated and side bent right T9 extended rotated and side bent right   L2 flexed rotated and side bent left  Sacrum right on right    Impression and Recommendations:     This case required medical decision making of moderate complexity. The above documentation has been reviewed and is accurate and complete Judi SaaZachary M Smith, DO       Note: This dictation was prepared with Dragon dictation along with smaller phrase technology. Any transcriptional errors that result from this process are unintentional.

## 2019-06-24 NOTE — Assessment & Plan Note (Signed)
Low back pain.  Seems to be doing relatively well.  Discussed core stability.  Follow-up again in 8 weeks

## 2019-07-22 IMAGING — CR DG ANKLE COMPLETE 3+V*L*
3 series · 3 of 3 positions shown · non-contrast
Comparison: None.

CLINICAL DATA: Left ankle pain secondary to a twisting injury
today.

EXAM:
LEFT ANKLE COMPLETE - 3+ VIEW

[x ankle ap left]
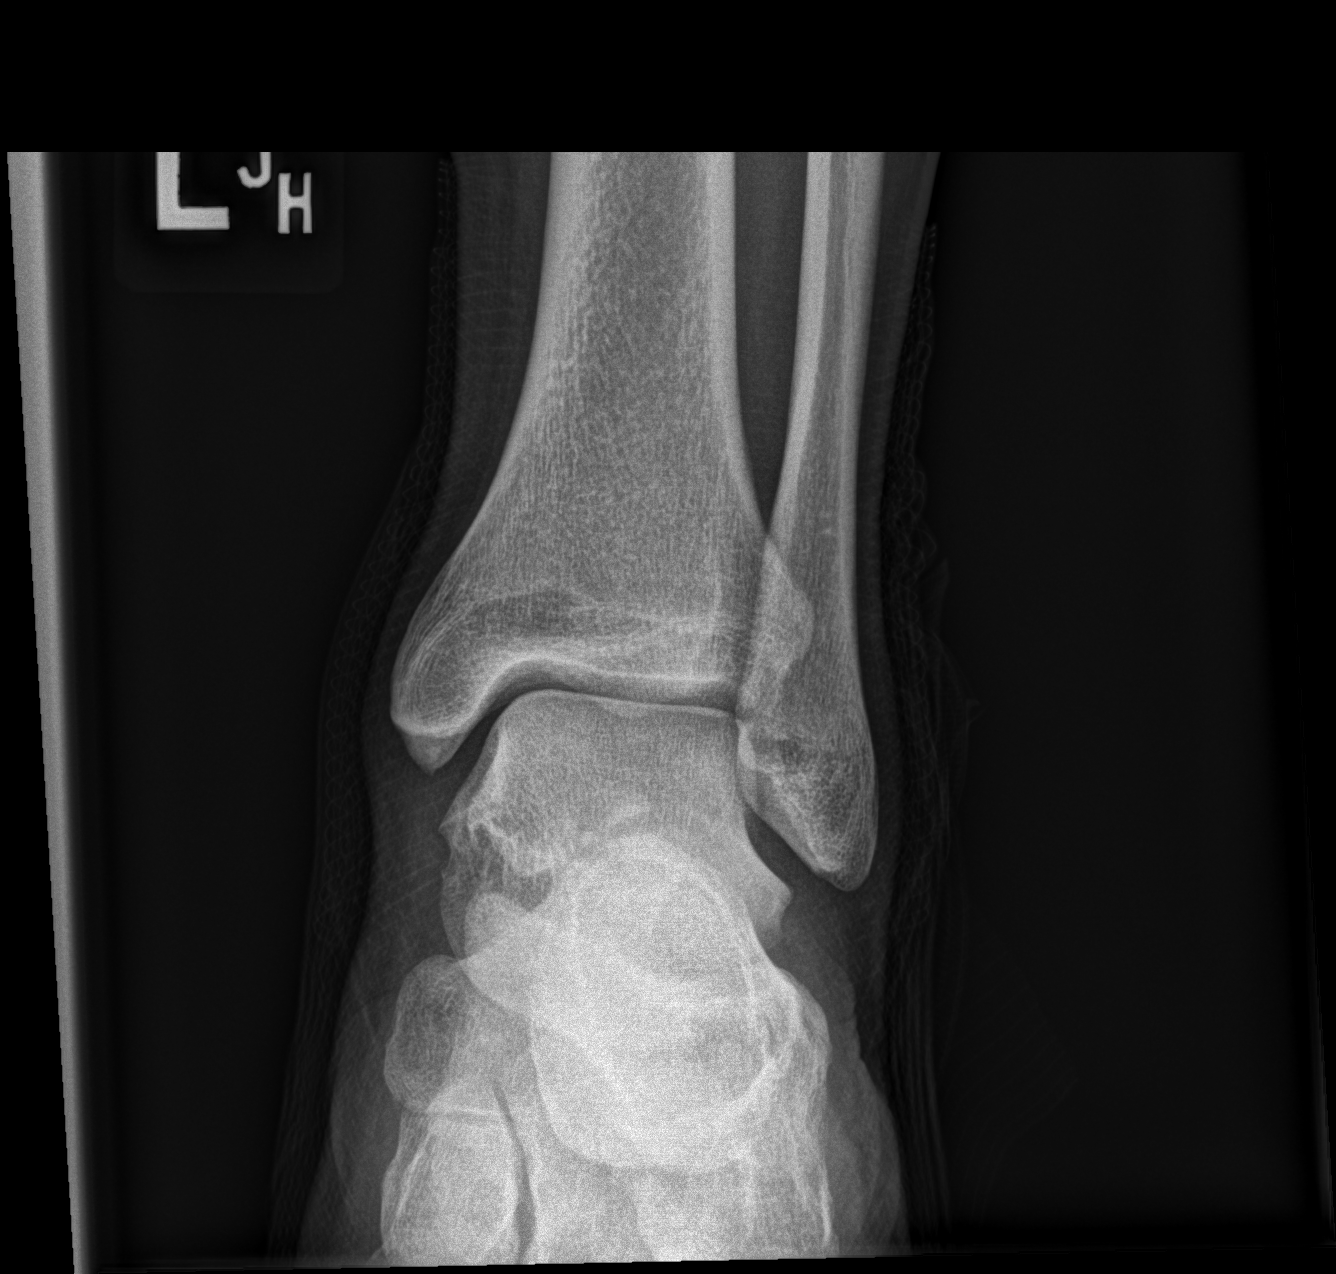

[x ankle obl left]
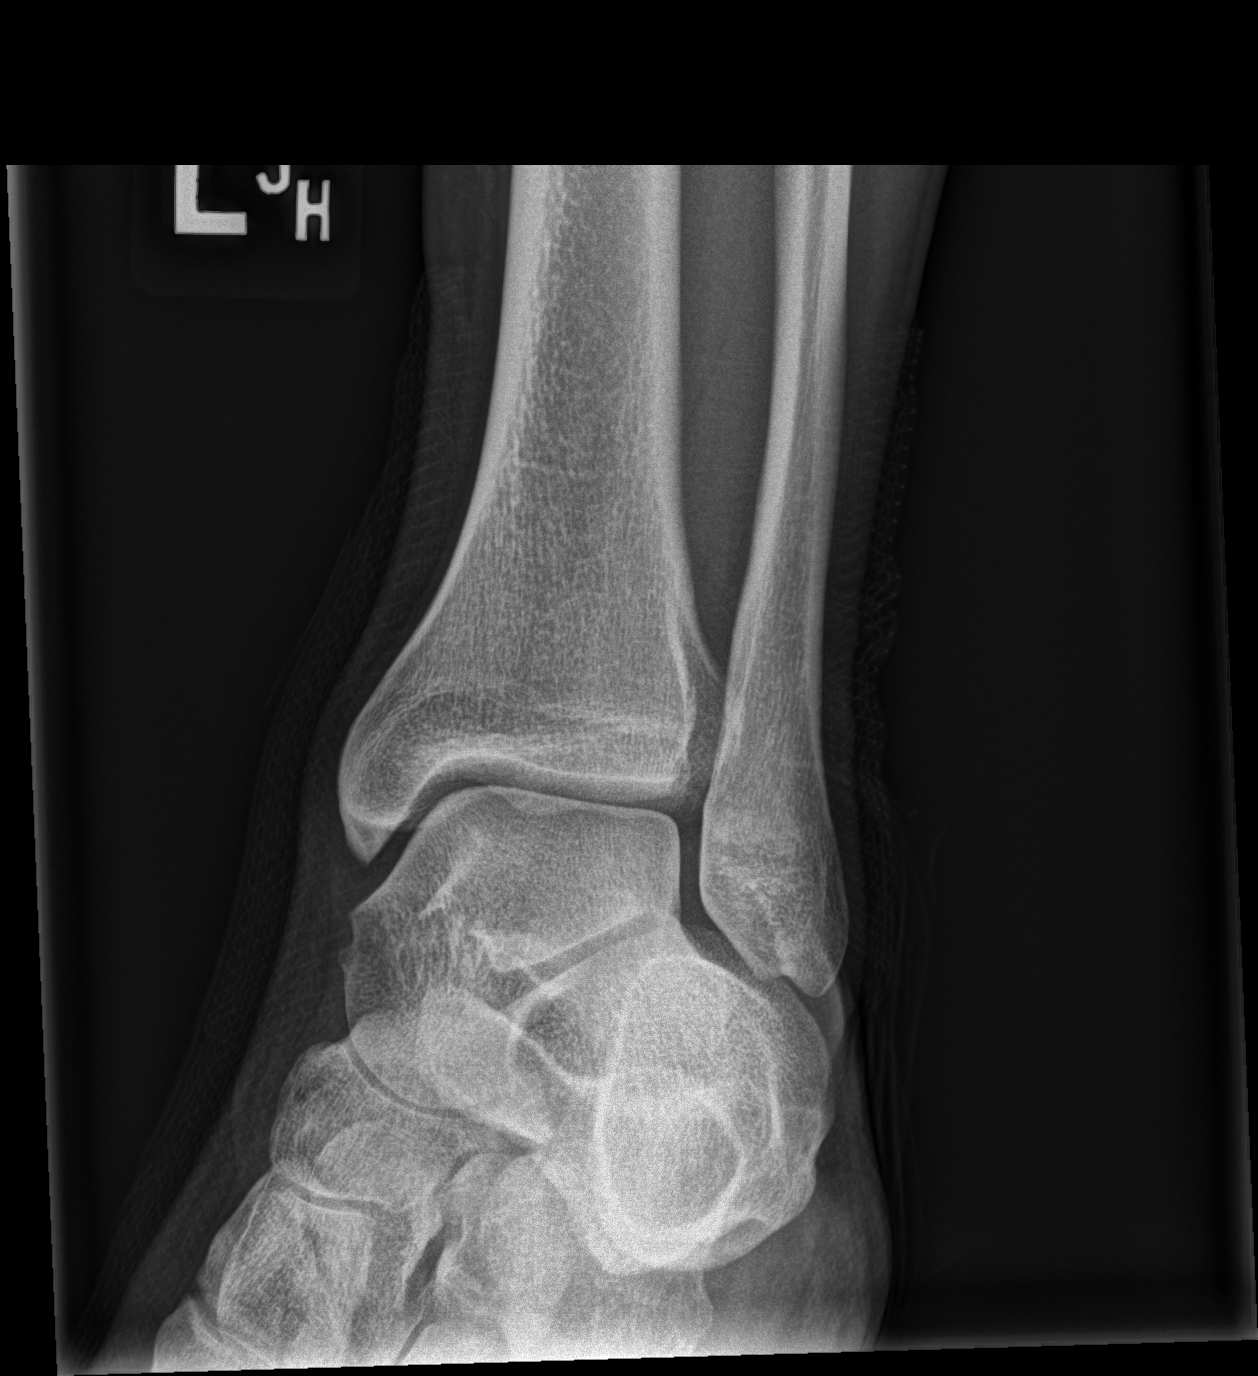

[x ankle lat left]
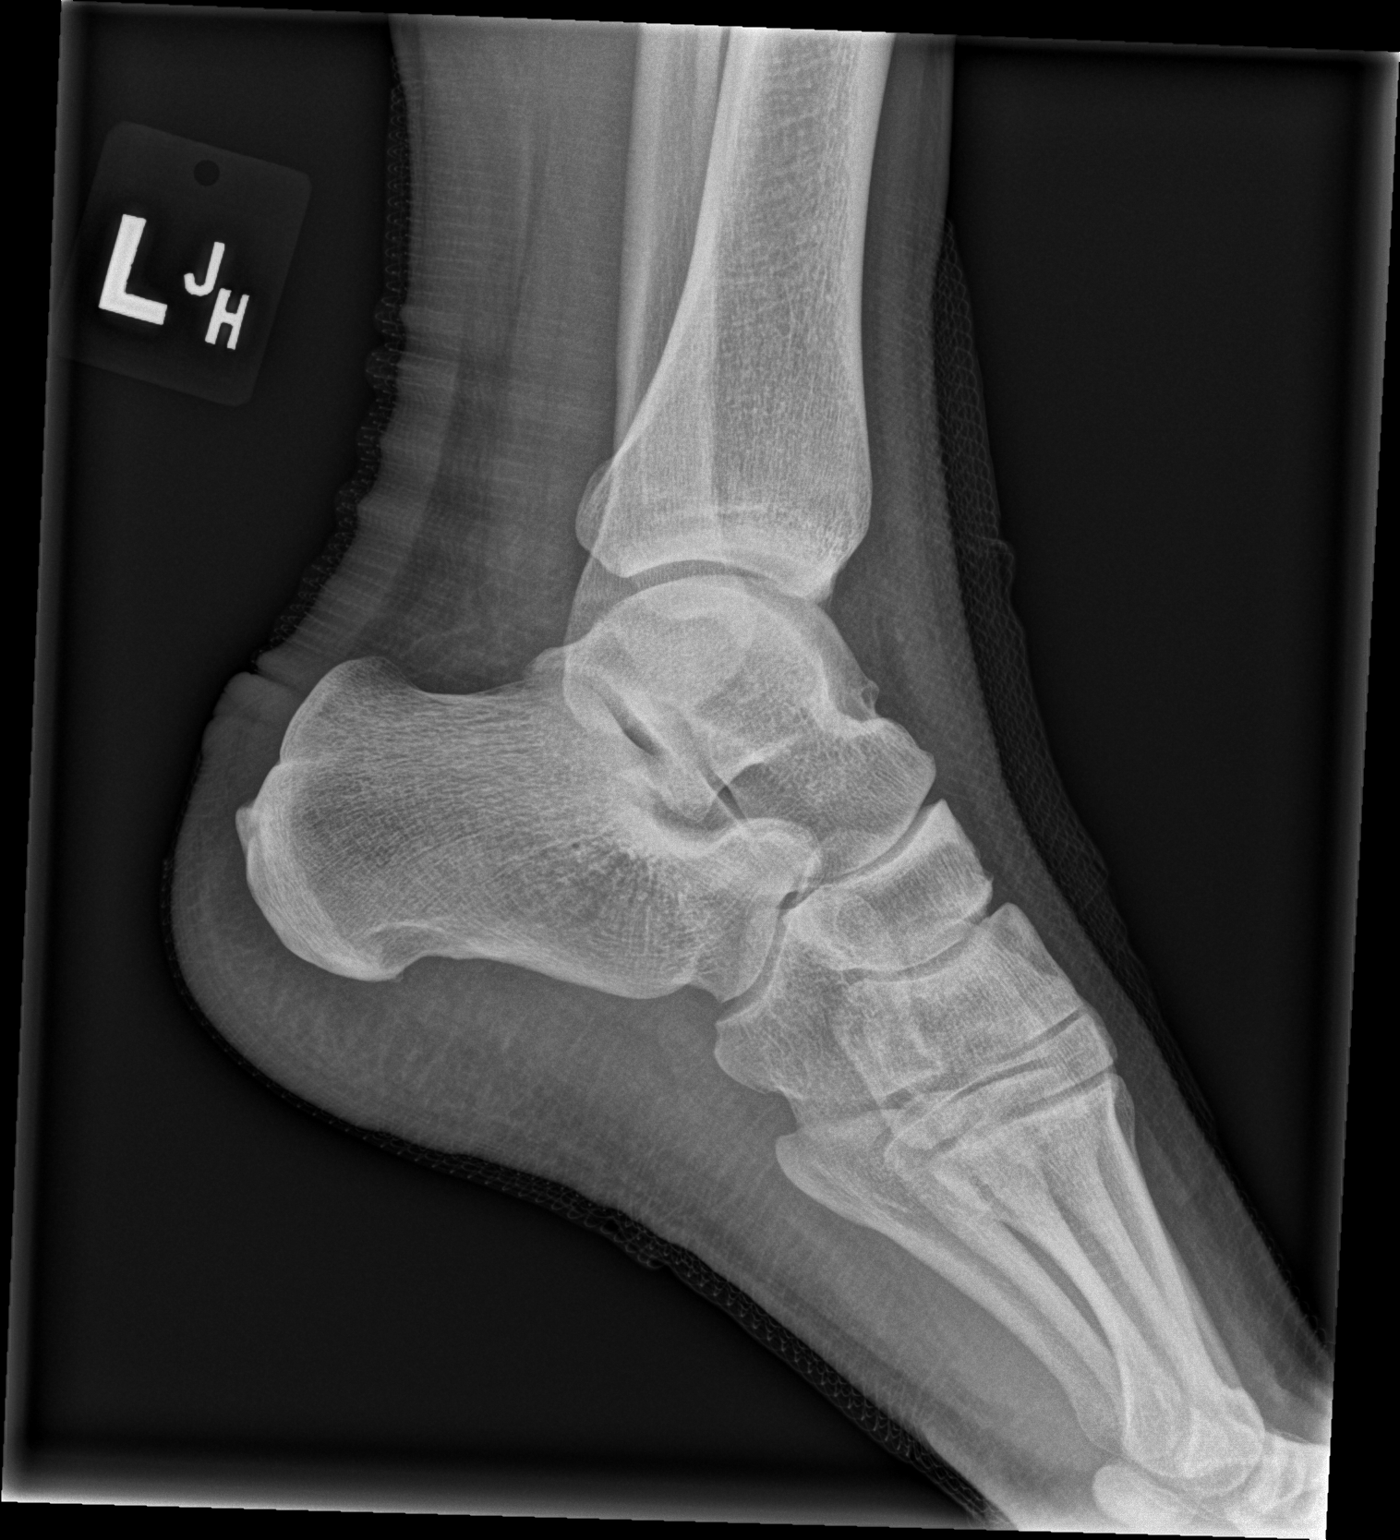

[3 of 3 positions shown; findings below may reference images not displayed]

FINDINGS: There is no evidence of fracture, dislocation, or joint effusion.
There is no evidence of arthropathy or other focal bone abnormality.
Soft tissues are unremarkable.
IMPRESSION: Negative.

## 2019-08-07 ENCOUNTER — Ambulatory Visit: Payer: 59 | Admitting: Family Medicine

## 2019-08-20 ENCOUNTER — Other Ambulatory Visit: Payer: Self-pay

## 2019-08-20 ENCOUNTER — Encounter: Payer: Self-pay | Admitting: Family Medicine

## 2019-08-20 ENCOUNTER — Ambulatory Visit: Payer: 59 | Admitting: Family Medicine

## 2019-08-20 VITALS — BP 108/80 | HR 97 | Ht 68.0 in | Wt 225.0 lb

## 2019-08-20 DIAGNOSIS — M999 Biomechanical lesion, unspecified: Secondary | ICD-10-CM

## 2019-08-20 DIAGNOSIS — M545 Low back pain, unspecified: Secondary | ICD-10-CM

## 2019-08-20 DIAGNOSIS — G8929 Other chronic pain: Secondary | ICD-10-CM

## 2019-08-20 MED ORDER — AMOXICILLIN-POT CLAVULANATE 875-125 MG PO TABS: 1.0000 | ORAL_TABLET | Freq: Two times a day (BID) | ORAL | 0 refills | Status: DC

## 2019-08-20 NOTE — Progress Notes (Signed)
[facility] [place], [place] [address] Phone: [phone] Subjective:   I Andrew Ruiz am serving as a Education administrator for Dr. Hulan Saas.   CC: Back pain follow-up  [id number]  Andrew Ruiz is a 28 y.o. male coming in with complaint of back pain. Back is doing ok.Believes he is getting better. "Shin splints" with running. States he thinks he has a sinus infection as well.  Has been working out with a Clinical research associate.  Doing some more high impact exercises.  Does not seem to hurt during that time but can hurt afterwards.     Past Medical History:  Diagnosis Date  . ADHD (attention deficit hyperactivity disorder)    [facility] no [facility]  . Allergy   . Diabetes mellitus without complication ([place])   . History of precordial chest pain   . Pilonidal abscess   . Pilonidal cyst with abscess 08/28/2011  . Ventricular pre-excitation    on ekg card eval  no restrictions   No past surgical history on file. Social History   Socioeconomic History  . Marital status: Single    Spouse name: Not on file  . Number of children: Not on file  . Years of education: Not on file  . Highest education level: Not on file  Occupational History  . Not on file  Social Needs  . Financial resource strain: Not on file  . Food insecurity    Worry: Not on file    Inability: Not on file  . Transportation needs    Medical: Not on file    Non-medical: Not on file  Tobacco Use  . Smoking status: Never Smoker  . Smokeless tobacco: Never Used  Substance and Sexual Activity  . Alcohol use: No  . Drug use: No  . Sexual activity: Not on file  Lifestyle  . Physical activity    Days per week: Not on file    Minutes per session: Not on file  . Stress: Not on file  Relationships  . Social Herbalist on phone: Not on file    Gets together: Not on file    Attends religious service: Not on file    Active member of club or organization: Not on  file    Attends meetings of clubs or organizations: Not on file    Relationship status: Not on file  Other Topics Concern  . 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 ADD / ADHD Brother   . Restless legs syndrome Unknown        grandmother on sinemet?    Current Outpatient Medications (Endocrine & Metabolic):  .  glucagon (GLUCAGON EMERGENCY) 1 MG injection, Inject 1 mg into the vein once as needed. [name]  HUMALOG KWIKPEN 100 UNIT/ML KwikPen, INJECT 0.2-0.3ML (20-30 UNITS TOTAL) INTO THE SKIN 3 TIMES DAILY WITHMEALS .  Insulin Glargine (BASAGLAR KWIKPEN) 100 UNIT/ML SOPN, Inject 0.4 mLs (40 Units total) into the skin daily.  Current Outpatient Medications (Cardiovascular):  .  simvastatin (ZOCOR) 20 MG tablet,   Current Outpatient Medications (Respiratory):  .  cetirizine (ZYRTEC) 10 MG tablet, Take  10 mg by mouth daily.  Current Outpatient Medications (Analgesics):  .  ibuprofen (ADVIL,MOTRIN) 600 MG tablet, Take 1 tablet (600 mg total) by mouth every 6 (six) hours as needed.   Current Outpatient Medications (Other):  [name].  ACCU-CHEK FASTCLIX LANCETS MISC,  .  Diclofenac Sodium (PENNSAID) 2 % SOLN, Place 2 application onto the skin 2 (two) times daily. .  Diclofenac Sodium 2 % SOLN, Place 2 g onto the skin 2 (two) times daily. [name].  gabapentin (NEURONTIN) 100 MG capsule, Take 2 capsules (200 mg total) by mouth at bedtime. [name].  glucose blood (CONTOUR NEXT TEST) test strip, TEST 8 TIMES PER DAY, AND LANCETS 8/DAY .  Insulin Infusion Pump Supplies (INSULIN PUMP SYRINGE RESERVOIR) MISC, 1 Device by Does not apply route every 3 (three) days. .  Insulin Infusion Pump Supplies (ULTRAFLEX) MISC, 1 Device by Does not apply route every 3 (three) days. 80 cm tube length, 8 mm  catheter length, ref # [id number] .  Insulin Pen Needle 32G X 4 MM MISC, Use to inject insulin 4 times per day. .  Melatonin 5 MG TABS, Take by mouth. PRN Sleep 1-2 tabs .  phentermine 37.5 MG capsule, Take 37.5 mg by mouth every morning. [name].  rOPINIRole (REQUIP) 0.5 MG tablet, TAKE 1 TABLET BY MOUTH 1 TO 3 HOURS BEFORE BEDTIME .  Vitamin D, Ergocalciferol, (DRISDOL) 1.25 MG (50000 UT) CAPS capsule, Take 1 capsule (50,000 Units total) by mouth every 7 (seven) days. .  Vitamin D, Ergocalciferol, (DRISDOL) 1.25 MG (50000 UT) CAPS capsule, Take 1 capsule (50,000 Units total) by mouth every 7 (seven) days. [name].  amoxicillin-clavulanate (AUGMENTIN) 875-125 MG tablet, Take 1 tablet by mouth 2 (two) times daily.    Past medical history, social, surgical and family history all reviewed in electronic medical record.  No pertanent information unless stated regarding to the chief complaint.   Review of Systems:  No headache, visual changes, nausea, vomiting, diarrhea, constipation, dizziness, abdominal pain, skin rash, fevers, chills, night sweats, weight loss, swollen lymph nodes, body aches, joint swelling, muscle aches, chest pain, shortness of breath, mood changes.   Objective  Blood pressure 108/80, pulse 97, height 5\' 8"  (1.727 m), weight 225 lb (102.1 kg), SpO2 98 %.   General: No apparent distress alert and oriented x3 mood and affect normal, dressed appropriately.  HEENT: Pupils equal, extraocular movements intact  Respiratory: Patient's speak in full sentences and does not appear short of breath  Cardiovascular: No lower extremity edema, non tender, no erythema  Skin: Warm dry intact with no signs of infection or rash on extremities or on axial skeleton.  Abdomen: Soft nontender  Neuro: Cranial nerves II through XII are intact, neurovascularly intact in all extremities with 2+ DTRs and 2+ pulses.  Lymph: No lymphadenopathy of posterior or anterior cervical chain or axillae bilaterally.  Gait  normal with good balance and coordination.  MSK:  Non tender with full range of motion and good stability and symmetric strength and tone of shoulders, elbows, wrist, hip, knee and ankles bilaterally.  Back exam mild improvement in core strength.  Mild loss of lordosis.  Some tenderness to palpation over the right sacroiliac joint.  Mild positive Faber test.  Negative straight leg test.  Lacks last 5 degrees of extension but otherwise full range of motion  Osteopathic findings  T9 extended rotated and side bent left L2 flexed rotated and side bent right Sacrum right on right    Impression and Recommendations:  This case required medical decision making of moderate complexity. The above documentation has been reviewed and is accurate and complete Lyndal Pulley, DO       Note: This dictation was prepared with Dragon dictation along with smaller phrase technology. Any transcriptional errors that result from this process are unintentional.

## 2019-08-20 NOTE — Assessment & Plan Note (Signed)
Multifactorial.  Responding well to osteopathic manipulation, discussed posture and ergonomics, discussed core strengthening.  Patient is working on a regular basis, discussed over-the-counter orthotics that I think will be beneficial, follow-up again in 7 to 8 weeks

## 2019-08-20 NOTE — Assessment & Plan Note (Signed)
Decision today to treat with OMT was based on Physical Exam  After verbal consent patient was treated with HVLA, ME, FPR techniques in cervical, thoracic, lumbar and sacral areas  Patient tolerated the procedure well with improvement in symptoms  Patient given exercises, stretches and lifestyle modifications  See medications in patient instructions if given  Patient will follow up in 7-8 weeks

## 2019-08-20 NOTE — Patient Instructions (Signed)
[place] orthotics  See me again in 7 weeks

## 2019-09-15 ENCOUNTER — Other Ambulatory Visit: Payer: Self-pay | Admitting: Internal Medicine

## 2019-10-07 ENCOUNTER — Telehealth: Payer: Self-pay | Admitting: Internal Medicine

## 2019-10-07 MED ORDER — FLUOCINONIDE-E 0.05 % EX CREA: 1.0000 "application " | TOPICAL_CREAM | Freq: Two times a day (BID) | CUTANEOUS | 1 refills | Status: AC

## 2019-10-07 NOTE — Telephone Encounter (Signed)
Please advise 

## 2019-10-07 NOTE — Telephone Encounter (Signed)
I  Will send in  Steroid topical but need to confirm pharmacy    I pended th order

## 2019-10-07 NOTE — Telephone Encounter (Signed)
Called patient patiented stated he wanted [place] sent in medication

## 2019-10-07 NOTE — Telephone Encounter (Signed)
Patient calling to inquired if a prednisone cream can be sent into pharmacy without OV. Patient states he has poison ivy on both of his arms.

## 2020-03-23 ENCOUNTER — Other Ambulatory Visit: Payer: Self-pay

## 2020-03-23 ENCOUNTER — Ambulatory Visit: Payer: 59 | Admitting: Family Medicine

## 2020-03-23 ENCOUNTER — Encounter: Payer: Self-pay | Admitting: Family Medicine

## 2020-03-23 VITALS — BP 130/74 | HR 87 | Ht 68.0 in | Wt 228.0 lb

## 2020-03-23 DIAGNOSIS — M999 Biomechanical lesion, unspecified: Secondary | ICD-10-CM

## 2020-03-23 DIAGNOSIS — M545 Low back pain, unspecified: Secondary | ICD-10-CM

## 2020-03-23 DIAGNOSIS — G8929 Other chronic pain: Secondary | ICD-10-CM

## 2020-03-23 MED ORDER — TIZANIDINE HCL 4 MG PO CAPS: ORAL_CAPSULE | ORAL | 0 refills | Status: DC

## 2020-03-23 NOTE — Assessment & Plan Note (Signed)
   Decision today to treat with OMT was based on Physical Exam  After verbal consent patient was treated with HVLA, ME, FPR techniques in cervical, thoracic,  lumbar and sacral areas, all areas are chronic   Patient tolerated the procedure well with improvement in symptoms  Patient given exercises, stretches and lifestyle modifications  See medications in patient instructions if given  Patient will follow up in 4-8 weeks 

## 2020-03-23 NOTE — Progress Notes (Signed)
[name] [address] [place] [address] Phone: [phone] Subjective:   I Ronelle Nigh am serving as a Neurosurgeon for Dr. Antoine Primas.  This visit occurred during the SARS-CoV-2 public health emergency.  Safety protocols were in place, including screening questions prior to the visit, additional usage of staff PPE, and extensive cleaning of exam room while observing appropriate contact time as indicated for disinfecting solutions.   I'm seeing this patient by the request  of:  Panosh, Neta Mends, MD  CC: Low back pain follow-up  [id number]  BERNICE MULLIN III is a 29 y.o. male coming in with complaint of back pain. Last seen on 08/20/2019 for OMT. Patient states he has been doing better. Has only had 3 bad days since his last visit.  Patient has been doing relatively well overall.  Still some mild tightness.  Patient is trying to be more active and would like to try to lose weight.  Looking for more guidance.  Not taking any real medications at this time on a regular basis.  Has been prescribed gabapentin previously.     Past Medical History:  Diagnosis Date  . ADHD (attention deficit hyperactivity disorder)    eval [facility] no [facility]  . Allergy   . Diabetes mellitus without complication (HCC)   . History of precordial chest pain   . Pilonidal abscess   . Pilonidal cyst with abscess 08/28/2011  . Ventricular pre-excitation    on ekg card eval  no restrictions   No past surgical history on file. Social History   Socioeconomic History  . Marital status: Single    Spouse name: Not on file  . Number of children: Not on file  . Years of education: Not on file  . Highest education level: Not on file  Occupational History  . Not on file  Tobacco Use  . Smoking status: Never Smoker  . Smokeless tobacco: Never Used  Substance and Sexual Activity  . Alcohol use: No  . Drug use: No  . Sexual activity: Not on file  Other Topics Concern   . Not on file  Social History Narrative   ** Merged History Encounter **       HH of 1-2   Non smoker some exercise   A  Emergency planning/management officer.    Lives alone in an apartment engaged to be married    Social Determinants of Corporate investment banker Strain:   . Difficulty of Paying Living Expenses:   Food Insecurity:   . Worried About Programme researcher, broadcasting/film/video in the Last Year:   . Barista in the Last Year:   Transportation Needs:   . Freight forwarder (Medical):   [name] Lack of Transportation (Non-Medical):   Physical Activity:   . Days of Exercise per Week:   . Minutes of Exercise per Session:   Stress:   . Feeling of Stress :   Social Connections:   . Frequency of Communication with Friends and Family:   . Frequency of Social Gatherings with Friends and Family:   . Attends Religious Services:   . Active Member of Clubs or Organizations:   . Attends Banker Meetings:   [name] Marital Status:    No Known Allergies Family History  Problem Relation Age of Onset  . ADD / ADHD Brother   . Restless legs syndrome Unknown        grandmother on sinemet?  Current Outpatient Medications (Endocrine & Metabolic):  .  glucagon (GLUCAGON EMERGENCY) 1 MG injection, Inject 1 mg into the vein once as needed. [name]  HUMALOG KWIKPEN 100 UNIT/ML KwikPen, INJECT 0.2-0.3ML (20-30 UNITS TOTAL) INTO THE SKIN 3 TIMES DAILY WITHMEALS .  Insulin Glargine (BASAGLAR KWIKPEN) 100 UNIT/ML SOPN, Inject 0.4 mLs (40 Units total) into the skin daily.  Current Outpatient Medications (Cardiovascular):  .  simvastatin (ZOCOR) 20 MG tablet,   Current Outpatient Medications (Respiratory):  .  cetirizine (ZYRTEC) 10 MG tablet, Take 10 mg by mouth daily.  Current Outpatient Medications (Analgesics):  .  ibuprofen (ADVIL,MOTRIN) 600 MG tablet, Take 1 tablet (600 mg total) by mouth every 6 (six) hours as needed.   Current Outpatient Medications (Other):  [name]  ACCU-CHEK FASTCLIX LANCETS MISC,  .   amoxicillin-clavulanate (AUGMENTIN) 875-125 MG tablet, Take 1 tablet by mouth 2 (two) times daily. .  Diclofenac Sodium (PENNSAID) 2 % SOLN, Place 2 application onto the skin 2 (two) times daily. .  Diclofenac Sodium 2 % SOLN, Place 2 g onto the skin 2 (two) times daily. .  fluocinonide-emollient (LIDEX-E) 0.05 % cream, Apply 1 application topically 2 (two) times daily. For poison ivy, not on face .  gabapentin (NEURONTIN) 100 MG capsule, Take 2 capsules (200 mg total) by mouth at bedtime. [name]  glucose blood (CONTOUR NEXT TEST) test strip, TEST 8 TIMES PER DAY, AND LANCETS 8/DAY .  Insulin Infusion Pump Supplies (INSULIN PUMP SYRINGE RESERVOIR) MISC, 1 Device by Does not apply route every 3 (three) days. .  Insulin Infusion Pump Supplies (ULTRAFLEX) MISC, 1 Device by Does not apply route every 3 (three) days. 80 cm tube length, 8 mm catheter length, ref # [id number] .  Insulin Pen Needle 32G X 4 MM MISC, Use to inject insulin 4 times per day. .  Melatonin 5 MG TABS, Take by mouth. PRN Sleep 1-2 tabs .  phentermine 37.5 MG capsule, Take 37.5 mg by mouth every morning. [name]  rOPINIRole (REQUIP) 0.5 MG tablet, TAKE 1 TABLET BY MOUTH 1 TO 3 HOURS BEFORE BEDTIME .  Vitamin D, Ergocalciferol, (DRISDOL) 1.25 MG (50000 UT) CAPS capsule, Take 1 capsule (50,000 Units total) by mouth every 7 (seven) days. .  Vitamin D, Ergocalciferol, (DRISDOL) 1.25 MG (50000 UT) CAPS capsule, Take 1 capsule (50,000 Units total) by mouth every 7 (seven) days. [name]  tiZANidine (ZANAFLEX) 4 MG capsule, 1 tablet at night   Reviewed prior external information including notes and imaging from  primary care provider As well as notes that were available from care everywhere and other healthcare systems.  Past medical history, social, surgical and family history all reviewed in electronic medical record.  No pertanent information unless stated regarding to the chief complaint.   Review of Systems:  No headache, visual changes, nausea,  vomiting, diarrhea, constipation, dizziness, abdominal pain, skin rash, fevers, chills, night sweats, weight loss, swollen lymph nodes, body aches, joint swelling, chest pain, shortness of breath, mood changes. POSITIVE muscle aches  Objective  Blood pressure 130/74, pulse 87, height 5\' 8"  (1.727 m), weight 228 lb (103.4 kg), SpO2 98 %.   General: No apparent distress alert and oriented x3 mood and affect normal, dressed appropriately.  HEENT: Pupils equal, extraocular movements intact  Respiratory: Patient's speak in full sentences and does not appear short of breath  Cardiovascular: No lower extremity edema, non tender, no erythema  Neuro: Cranial nerves II through XII are intact, neurovascularly intact in all extremities with 2+ DTRs and  2+ pulses.  Gait normal with good balance and coordination.  MSK:  Non tender with full range of motion and good stability and symmetric strength and tone of shoulders, elbows, wrist, hip, knee and ankles bilaterally.  Low back exam she does show some tightness noted in the thoracolumbar juncture as well as the lumbosacral area seems to be diffusely on both sides.  No spinous process tenderness.  Mild weakening of core strength with previous exam.  Deep tendon reflexes intact.  Tightness of the hip flexors noted and tightness with straight leg test but no radicular symptoms  Osteopathic findings  T7 extended rotated and side bent left L2 flexed rotated and side bent right Sacrum right on right    Impression and Recommendations:     This case required medical decision making of moderate complexity. The above documentation has been reviewed and is accurate and complete Lyndal Pulley, DO       Note: This dictation was prepared with Dragon dictation along with smaller phrase technology. Any transcriptional errors that result from this process are unintentional.

## 2020-03-23 NOTE — Assessment & Plan Note (Signed)
Low back pain overall.  Patient does have some muscle imbalances.  Discussed signs of mild hip abductor strengthening hip flexor strength.  Discussed which activities to do which wants to avoid.  Increase activity slowly over the course the next several weeks.  Follow-up again in 4 to 8 weeks.

## 2020-03-23 NOTE — Patient Instructions (Addendum)
Good to see you L glutamine 2 grams daily especially with activity Zanaflex 1 pill at night for 3 nights when in a lot of pain Exercise 3 times a week See me again in 2 months

## 2020-03-30 ENCOUNTER — Ambulatory Visit: Payer: 59 | Admitting: Family Medicine

## 2020-05-12 ENCOUNTER — Other Ambulatory Visit: Payer: Self-pay | Admitting: Internal Medicine

## 2020-05-13 ENCOUNTER — Other Ambulatory Visit: Payer: Self-pay | Admitting: Family Medicine

## 2020-07-13 ENCOUNTER — Other Ambulatory Visit: Payer: Self-pay | Admitting: Family Medicine

## 2020-07-13 ENCOUNTER — Other Ambulatory Visit: Payer: Self-pay | Admitting: Internal Medicine

## 2020-08-13 ENCOUNTER — Other Ambulatory Visit: Payer: Self-pay | Admitting: Internal Medicine

## 2020-08-30 ENCOUNTER — Other Ambulatory Visit: Payer: 59

## 2020-09-08 ENCOUNTER — Other Ambulatory Visit: Payer: Self-pay | Admitting: Internal Medicine

## 2020-09-13 IMAGING — DX RIGHT ANKLE - COMPLETE 3+ VIEW
3 series · 3 of 3 positions shown · non-contrast
Comparison: None.

CLINICAL DATA: Pain and swelling following twisting injury

EXAM:
RIGHT ANKLE - COMPLETE 3+ VIEW

[ankle ap]
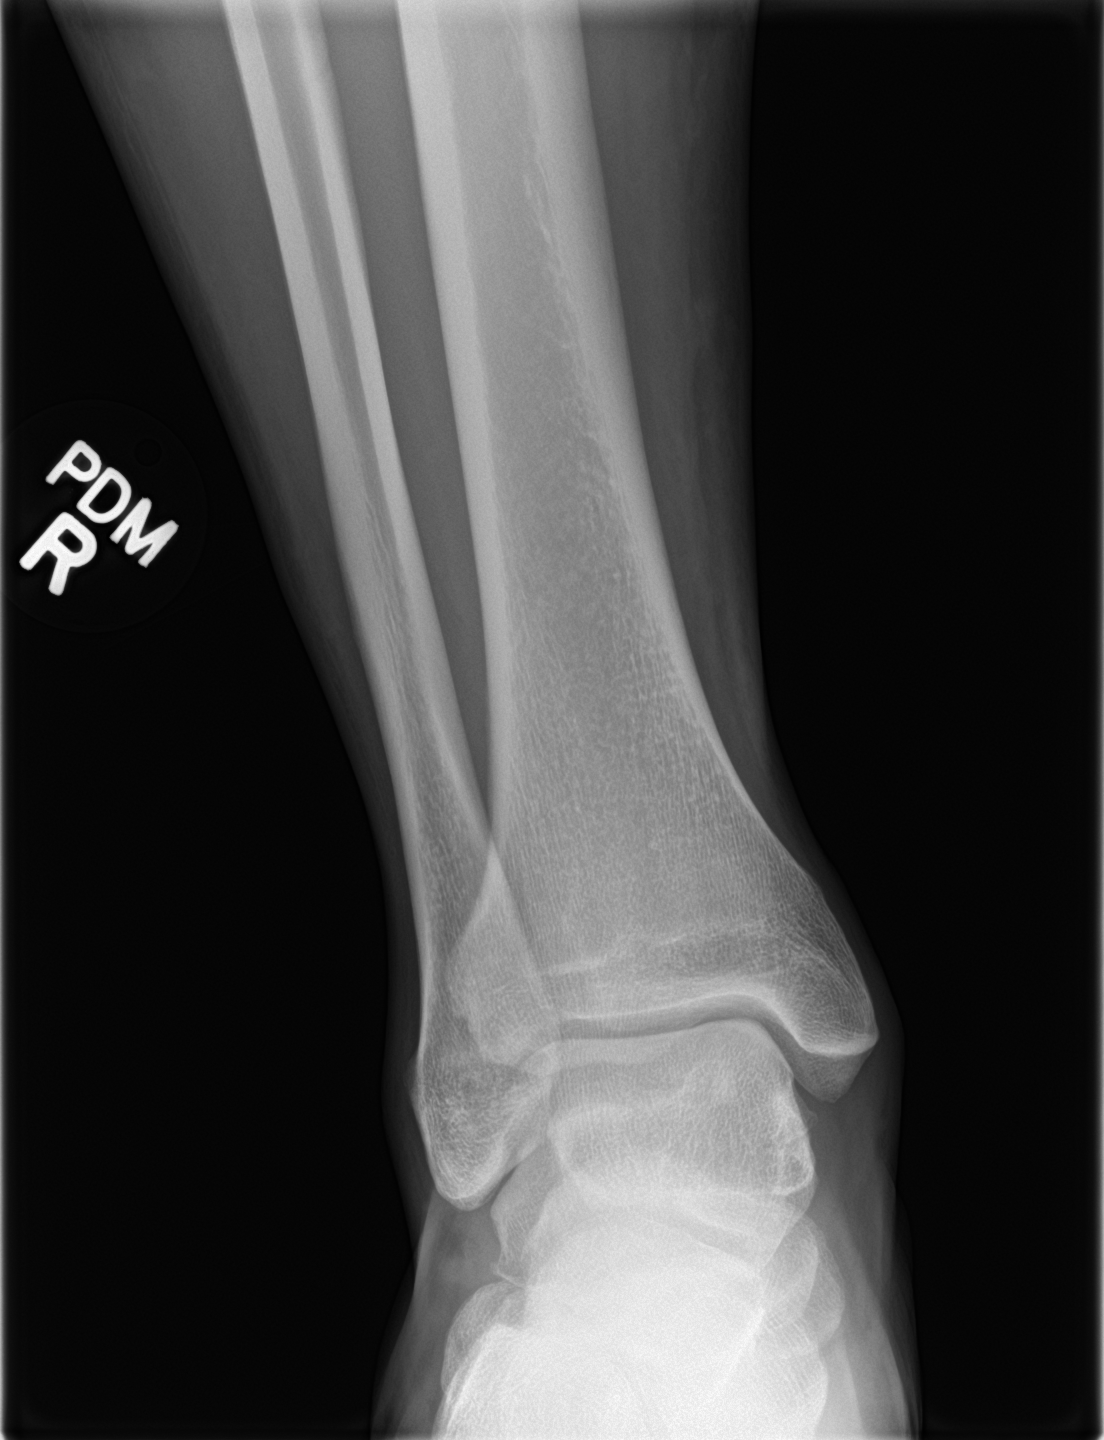

[ankle obl]
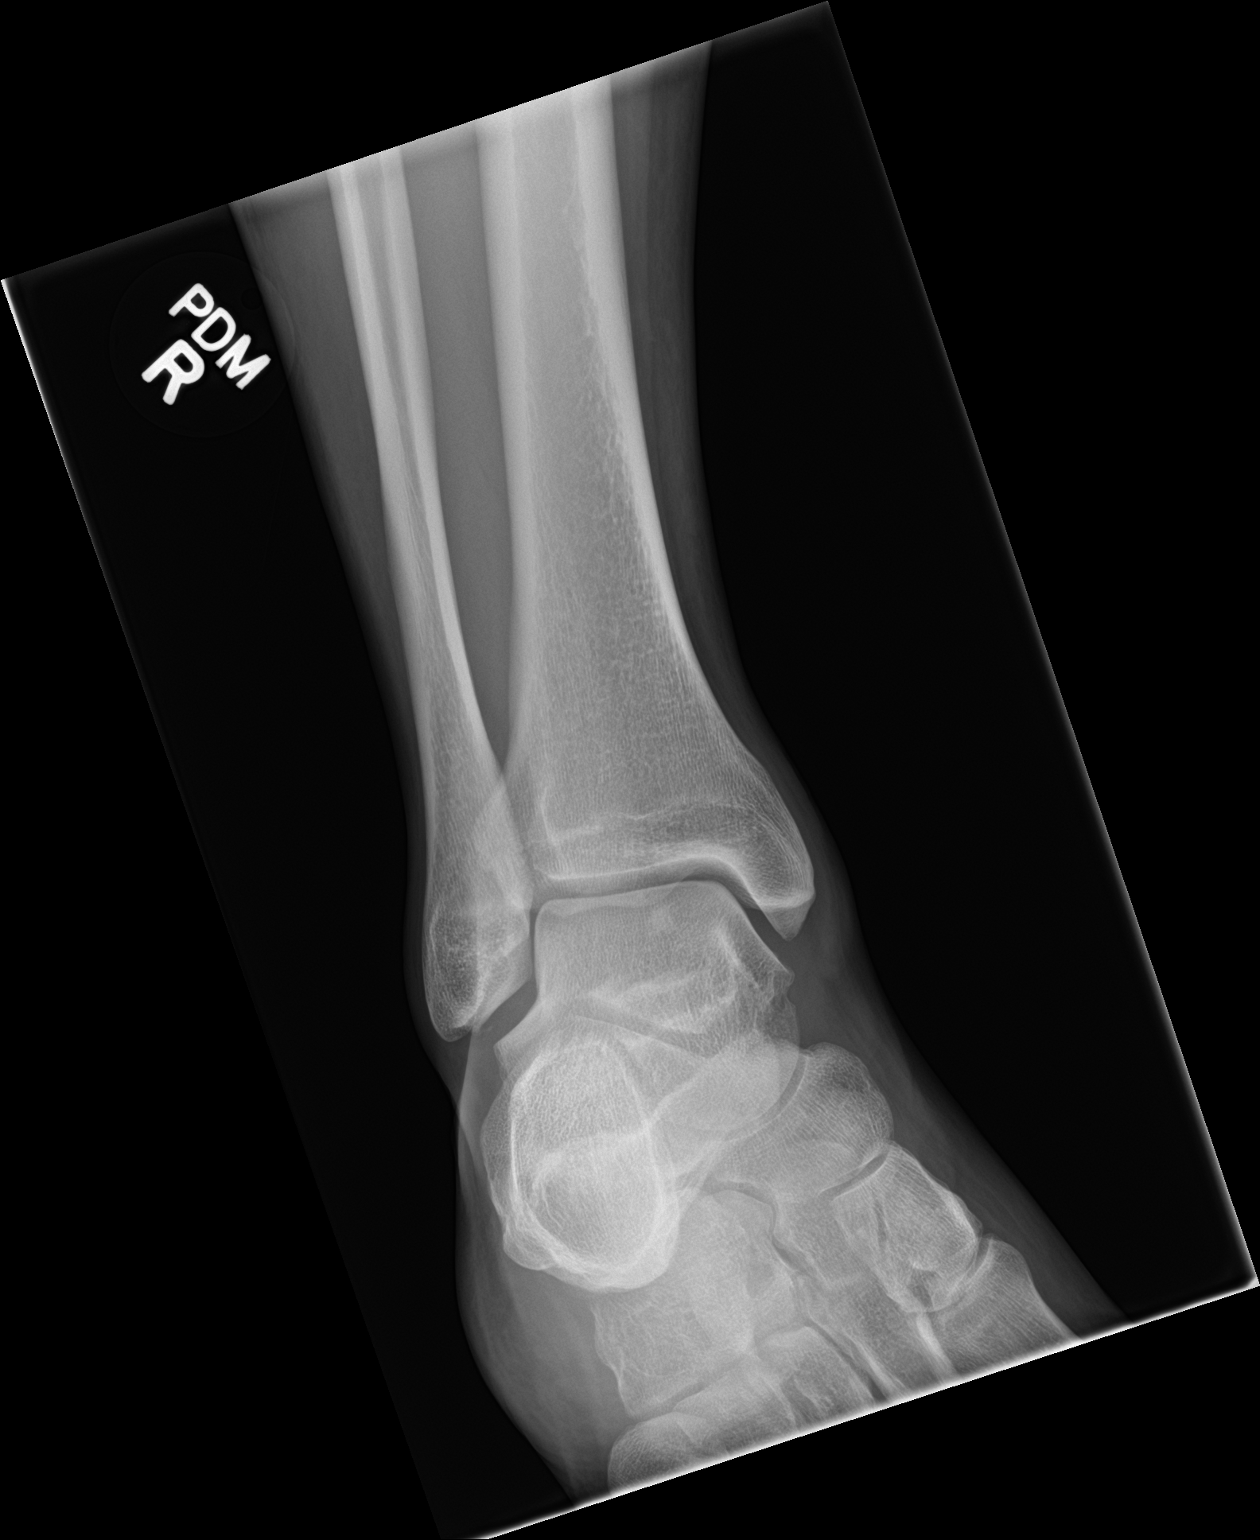

[ankle lat]
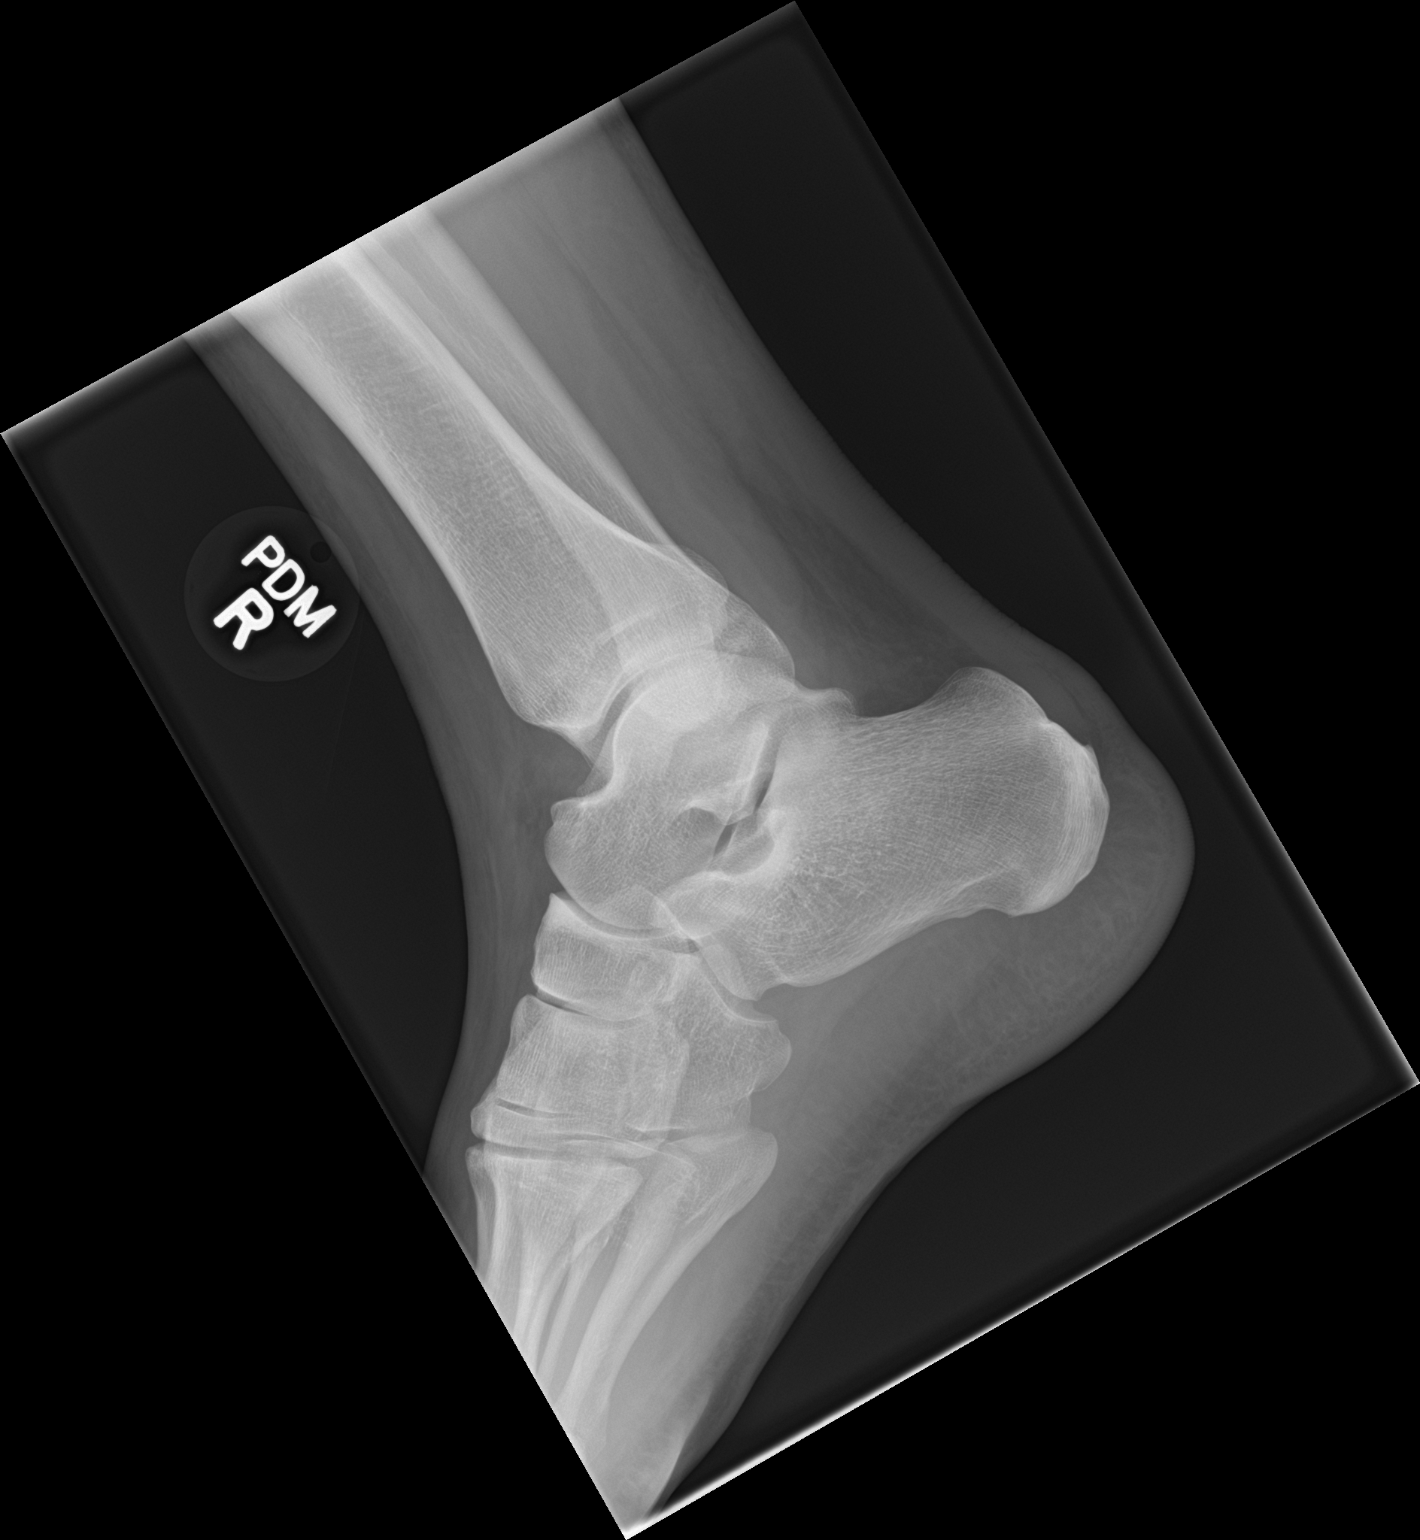

[3 of 3 positions shown; findings below may reference images not displayed]

FINDINGS: Frontal, oblique, and lateral views obtained. There is a tiny
calcification inferior to the lateral malleolus which may represent
an age uncertain small avulsion. Beyond this questionable small
avulsion in the lateral malleolar region. There is no evident
fracture. No evident joint effusion. No appreciable joint space
narrowing or erosion. Ankle mortise appears intact.
IMPRESSION: Age uncertain probable small avulsion in the lateral malleolar
region. No other evident fracture. No appreciable arthropathy. Ankle
mortise appears intact.

## 2020-09-20 ENCOUNTER — Emergency Department (HOSPITAL_COMMUNITY)
Admission: EM | Admit: 2020-09-20 | Discharge: 2020-09-20 | Disposition: A | Discharge status: Home / Self Care | Payer: No Typology Code available for payment source | Attending: Emergency Medicine | Admitting: Emergency Medicine

## 2020-09-20 ENCOUNTER — Encounter (HOSPITAL_COMMUNITY): Payer: Self-pay

## 2020-09-20 DIAGNOSIS — R739 Hyperglycemia, unspecified: Secondary | ICD-10-CM

## 2020-09-20 DIAGNOSIS — Z794 Long term (current) use of insulin: Secondary | ICD-10-CM | POA: Insufficient documentation

## 2020-09-20 DIAGNOSIS — Z87891 Personal history of nicotine dependence: Secondary | ICD-10-CM | POA: Diagnosis not present

## 2020-09-20 DIAGNOSIS — I10 Essential (primary) hypertension: Secondary | ICD-10-CM | POA: Diagnosis not present

## 2020-09-20 DIAGNOSIS — E1065 Type 1 diabetes mellitus with hyperglycemia: Secondary | ICD-10-CM | POA: Insufficient documentation

## 2020-09-20 LAB — CBG MONITORING, ED: Glucose-Capillary: 222 mg/dL — ABNORMAL HIGH (ref 70–99)

## 2020-09-20 NOTE — Discharge Instructions (Addendum)
Keep a close eye on your sugar at home.  Use your insulin as needed.
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
## 2020-10-19 NOTE — Progress Notes (Signed)
Chief Complaint  Patient presents with  . Annual Exam    HPI: Patient  Andrew Ruiz III  29 y.o. comes in today for Preventive Health Care visit  He [facility] for his type 1 diabetes and is in reasonably well controlled last A1c 7 4 but usually is low sevens. He had been taking Requip for his restless leg with some help however had some escalation in dose and is trying to control without regular medication. He stopped cold [place] but has it on hand for occasional knee. Trying to limit caffeine to help symptoms. 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 Currently working the evenings 2 PM to 1 AM. 44 hours a week. Contemplating having a child asks about health and genetics.  Wife has some genetic tendency and they will be a marker evaluated for some type of possibly cancer risk. Family history maternal grandmother colon cancer paternal grand mother Alzheimer's in the 4460s maternal grandfather lung cancer he smoked and a maternal uncle who had prostate cancer in his 4250s.   Attentional. Issues coping without medication. He had Covid infection last December and recently got the Covid vaccine series because of encouragement to do so at employment had some transient side effects sore arm etc. He takes simvastatin for lipid management His endocrinologist checks lab monitoring. He has not had a problem with elevated blood pressure. He is aware that he would like to lose weight and get a better BMI. Back   Issues controlled Saw dr Katrinka BlazingSmith    Health Maintenance  Topic Date Due  . Hepatitis C Screening  Never done  . HIV Screening  Never done  . HEMOGLOBIN A1C  12/28/2018  . FOOT EXAM  06/28/2019  . URINE MICROALBUMIN  06/28/2019  . OPHTHALMOLOGY EXAM  08/12/2019  . TETANUS/TDAP  10/17/2025  . INFLUENZA VACCINE  Completed  . PNEUMOCOCCAL POLYSACCHARIDE VACCINE AGE 26-64 HIGH RISK  Completed  . 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 GI /GU: No adominal pain, vomiting, change in bowel habits. No blood in the stool. No significant GU symptoms. SKIN/HEME: ,no acute skin rashes suspicious lesions or bleeding. No lymphadenopathy, nodules, masses.  NEURO/ PSYCH:  No neurologic signs such as weakness numbness. No depression anxiety. IMM/ Allergy: No unusual infections.  Allergy .   REST of 12 system review negative except as per HPI   Past Medical History:  Diagnosis Date  . ADHD (attention deficit hyperactivity disorder)    eval at [facility] no ld  . Allergy   . Diabetes mellitus without complication (HCC)   . History of precordial chest pain   . Pilonidal abscess   . Pilonidal cyst with abscess 08/28/2011  . Ventricular pre-excitation    on ekg card eval  no restrictions    History reviewed. No pertinent surgical history.  Family History  Problem Relation Age of Onset  . ADD / ADHD Brother   . Restless legs syndrome Paternal Aunt        grandmother on sinemet?  . Cancer Maternal Uncle   . Cancer Maternal  Grandmother   . Cancer Paternal Grandfather     Social History   Socioeconomic History  . Marital status: Single    Spouse name: Not on file  . Number of children: Not on file  . Years of education: Not on file  . Highest education level: Not on file  Occupational History  . Not on file  Tobacco Use  . Smoking status: Never Smoker  . Smokeless tobacco: Never Used  Vaping Use  . Vaping Use: Never used  Substance and Sexual Activity  . Alcohol use: No  . Drug use: No  . Sexual activity: Not on file   Other Topics Concern  . Not on file  Social History Narrative   ** Merged History Encounter **       HH of -2   Non smoker some exercise   A  Emergency planning/management officer.    Married  To Emergency planning/management officer    Social Determinants of Health   Financial Resource Strain:   . Difficulty of Paying Living Expenses: Not on file  Food Insecurity:   . Worried About Programme researcher, broadcasting/film/video in the Last Year: Not on file  . Ran Out of Food in the Last Year: Not on file  Transportation Needs:   . Lack of Transportation (Medical): Not on file  . Lack of Transportation (Non-Medical): Not on file  Physical Activity:   . Days of Exercise per Week: Not on file  . Minutes of Exercise per Session: Not on file  Stress:   . Feeling of Stress : Not on file  Social Connections:   . Frequency of Communication with Friends and Family: Not on file  . Frequency of Social Gatherings with Friends and Family: Not on file  . Attends Religious Services: Not on file  . Active Member of Clubs or Organizations: Not on file  . Attends Banker Meetings: Not on file  . Marital Status: Not on file    Outpatient Medications Prior to Visit  Medication Sig Dispense Refill  . ACCU-CHEK FASTCLIX LANCETS MISC     . cetirizine (ZYRTEC) 10 MG tablet Take 10 mg by mouth daily.    . fluocinonide-emollient (LIDEX-E) 0.05 % cream Apply 1 application topically 2 (two) times daily. For poison ivy, not on face 30 g 1  . glucagon (GLUCAGON EMERGENCY) 1 MG injection Inject 1 mg into the vein once as needed. 1 each 12  . glucose blood (CONTOUR NEXT TEST) test strip TEST 8 TIMES PER DAY, AND LANCETS 8/DAY 240 each 11  . HUMALOG KWIKPEN 100 UNIT/ML KwikPen INJECT 0.2-0.3ML (20-30 UNITS TOTAL) INTO THE SKIN 3 TIMES DAILY WITHMEALS 30 mL 1  . ibuprofen (ADVIL,MOTRIN) 600 MG tablet Take 1 tablet (600 mg total) by mouth every 6 (six) hours as needed. 30 tablet 0  . Insulin Glargine (BASAGLAR KWIKPEN) 100 UNIT/ML SOPN Inject 0.4 mLs (40 Units  total) into the skin daily. 5 pen 5  . Insulin Infusion Pump Supplies (INSULIN PUMP SYRINGE RESERVOIR) MISC 1 Device by Does not apply route every 3 (three) days. 30 each 3  . Insulin Infusion Pump Supplies (ULTRAFLEX) MISC 1 Device by Does not apply route every 3 (three) days. 80 cm tube length, 8 mm catheter length, ref # [id number] 10 each 1  . Insulin Pen Needle 32G X 4 MM MISC Use to inject insulin 4 times per day. 400 each 5  . Melatonin 5 MG TABS Take by mouth. PRN Sleep 1-2 tabs    .  rOPINIRole (REQUIP) 0.5 MG tablet TAKE 1 TABLET BY MOUTH 1 TO 3 HOURS BEFORE BEDTIME--Please schedule a follow up visit for refills. [phone] 30 tablet 0  . simvastatin (ZOCOR) 20 MG tablet     . tiZANidine (ZANAFLEX) 4 MG tablet TAKE 1 TABLET BY MOUTH EVERY NIGHT AT BEDTIME 30 tablet 0  . Vitamin D, Ergocalciferol, (DRISDOL) 1.25 MG (50000 UT) CAPS capsule Take 1 capsule (50,000 Units total) by mouth every 7 (seven) days. 12 capsule 0  . Vitamin D, Ergocalciferol, (DRISDOL) 1.25 MG (50000 UT) CAPS capsule Take 1 capsule (50,000 Units total) by mouth every 7 (seven) days. 12 capsule 0  . Diclofenac Sodium (PENNSAID) 2 % SOLN Place 2 application onto the skin 2 (two) times daily. (Patient not taking: Reported on 10/20/2020) 112 g 3  . Diclofenac Sodium 2 % SOLN Place 2 g onto the skin 2 (two) times daily. (Patient not taking: Reported on 10/20/2020) 112 g 3  . gabapentin (NEURONTIN) 100 MG capsule Take 2 capsules (200 mg total) by mouth at bedtime. (Patient not taking: Reported on 10/20/2020) 60 capsule 3  . phentermine 37.5 MG capsule Take 37.5 mg by mouth every morning. (Patient not taking: Reported on 10/20/2020)    . amoxicillin-clavulanate (AUGMENTIN) 875-125 MG tablet Take 1 tablet by mouth 2 (two) times daily. 20 tablet 0   No facility-administered medications prior to visit.     EXAM:  BP (!) 142/82   Pulse 73   Temp 97.9 F (36.6 C) (Oral)   Ht  (1.727 m)   Wt 228 lb 6.4 oz (103.6 kg)    SpO2 98%   BMI 34.73 kg/m   Body mass index is 34.73 kg/m. Wt Readings from Last 3 Encounters:  10/20/20 228 lb 6.4 oz (103.6 kg)  03/23/20 228 lb (103.4 kg)  08/20/19 225 lb (102.1 kg)    Physical Exam: Vital signs reviewed UJW:JXBJ is a well-developed well-nourished alert cooperative    who appearsr stated age in no acute distress.  HEENT: normocephalic atraumatic , Eyes: PERRL EOM's full, conjunctiva clear, Nares: paten,t no deformity discharge or tenderness., Ears: no deformity EAC's clear TMs with normal landmarks. Mouth:masked NECK: supple without masses, thyromegaly or bruits. CHEST/PULM:  Clear to auscultation and percussion breath sounds equal no wheeze , rales or rhonchi. No chest wall deformities or tenderness. Breast: normal by inspection . No dimpling, discharge, masses, tenderness or discharge . CV: PMI is nondisplaced, S1 S2 no gallops, murmurs, rubs. Peripheral pulses are full without delay.No JVD .  ABDOMEN: Bowel sounds normal nontender  No guard or rebound, no hepato splenomegal no CVA tenderness.  No hernia. Extremtities:  No clubbing cyanosis or edema, no acute joint swelling or redness no focal atrophy NEURO:  Oriented x3, cranial nerves 3-12 appear to be intact, no obvious focal weakness,gait within normal limits no abnormal reflexes or asymmetrical SKIN: No acute rashes normal turgor, color, no bruising or petechiae. PSYCH: Oriented, good eye contact, no obvious depression anxiety, cognition and judgment appear normal. LN: no cervical axillary inguinal adenopathy  Lab Results  Component Value Date   WBC 8.7 06/18/2017   HGB 7.1 (A) 01/13/2019   HCT 43.1 06/18/2017   PLT 203 06/18/2017   GLUCOSE 238 (H) 06/18/2017   CHOL 159 01/13/2019   TRIG 176 (A) 01/13/2019   HDL 36 01/13/2019   LDLDIRECT 119.0 10/27/2015   LDLCALC 88 01/13/2019   ALT 49 (A) 01/13/2019   AST 24 01/13/2019   NA 138 01/13/2019  K 4.8 01/13/2019   CL 102 06/18/2017   CREATININE  1.0 01/13/2019   BUN 15 06/18/2017   CO2 24 06/18/2017   TSH 1.67 10/27/2015   HGBA1C 7.7 (A) 06/27/2018   MICROALBUR <0.7 06/27/2018    BP Readings from Last 3 Encounters:  10/20/20 (!) 142/82  03/23/20 130/74  08/20/19 108/80    Lab results reviewed with patient   ASSESSMENT AND PLAN:  Discussed the following assessment and plan:    ICD-10-CM   1. Visit for preventive health examination  Z00.00   2. Medication management  Z79.899   3. RLS (restless legs syndrome)  G25.81   4. Personal history of COVID-19  Z86.16    12 20   Reviewed family history reviewed teen or advised screening up-to-date. Can also consider genetic counseling. And talk to endocrinologist about  Inc projgeny  of type 1 diabetes   and monitoring. At this time appropriate goals to improve weight increase exercise Can use Requip if helpful call for refills if needs 1. Blood pressure was not at goal today but office reading. Discussed getting blood pressure readings and data points at home with monitor and follow-up as blood pressure control is a compelling control factor for future cardiovascular health. Return in about 1 year (around 10/20/2021) for 1  or as needed .  Patient Care Team: Johnica Armwood, Neta Mends, MD as PCP - General (Internal Medicine) Duke Salvia, MD as Consulting Physician (Cardiology) Alan Mulder, MD as Attending Physician (Endocrinology) Patient Instructions    Continue lifestyle intervention healthy eating and exercise .   Have  Your endocrinologist send [place]  Lab readings .   Let us know  if you want refill of  Requip.,     Health Maintenance, Male Adopting a healthy lifestyle and getting preventive care are important in promoting health and wellness. Ask your health care provider about:  The right schedule for you to have regular tests and exams.  Things you can do on your own to prevent diseases and keep yourself healthy. What should I know about diet, weight, and  exercise? Eat a healthy diet   Eat a diet that includes plenty of vegetables, fruits, low-fat dairy products, and lean protein.  Do not eat a lot of foods that are high in solid fats, added sugars, or sodium. Maintain a healthy weight Body mass index (BMI) is a measurement that can be used to identify possible weight problems. It estimates body fat based on height and weight. Your health care provider can help determine your BMI and help you achieve or maintain a healthy weight. Get regular exercise Get regular exercise. This is one of the most important things you can do for your health. Most adults should:  Exercise for at least 150 minutes each week. The exercise should increase your heart rate and make you sweat (moderate-intensity exercise).  Do strengthening exercises at least twice a week. This is in addition to the moderate-intensity exercise.  Spend less time sitting. Even light physical activity can be beneficial. Watch cholesterol and blood lipids Have your blood tested for lipids and cholesterol at 30 years of age, then have this test every 5 years. You may need to have your cholesterol levels checked more often if:  Your lipid or cholesterol levels are high.  You are older than 30 years of age.  You are at high risk for heart disease. What should I know about cancer screening? Many types of cancers can be detected early and may  often be prevented. Depending on your health history and family history, you may need to have cancer screening at various ages. This may include screening for:  Colorectal cancer.  Prostate cancer.  Skin cancer.  Lung cancer. What should I know about heart disease, diabetes, and high blood pressure? Blood pressure and heart disease  High blood pressure causes heart disease and increases the risk of stroke. This is more likely to develop in people who have high blood pressure readings, are of African descent, or are overweight.  Talk with  your health care provider about your target blood pressure readings.  Have your blood pressure checked: ? Every 3-5 years if you are 67-50 years of age. ? Every year if you are 21 years old or older.  If you are between the ages of 20 and 21 and are a current or former smoker, ask your health care provider if you should have a one-time screening for abdominal aortic aneurysm (AAA). Diabetes Have regular diabetes screenings. This checks your fasting blood sugar level. Have the screening done:  Once every three years after age 6 if you are at a normal weight and have a low risk for diabetes.  More often and at a younger age if you are overweight or have a high risk for diabetes. What should I know about preventing infection? Hepatitis B If you have a higher risk for hepatitis B, you should be screened for this virus. Talk with your health care provider to find out if you are at risk for hepatitis B infection. Hepatitis C Blood testing is recommended for:  Everyone born from [birth year] through 1966.  Anyone with known risk factors for hepatitis C. Sexually transmitted infections (STIs)  You should be screened each year for STIs, including gonorrhea and chlamydia, if: ? You are sexually active and are younger than 30 years of age. ? You are older than 30 years of age and your health care provider tells you that you are at risk for this type of infection. ? Your sexual activity has changed since you were last screened, and you are at increased risk for chlamydia or gonorrhea. Ask your health care provider if you are at risk.  Ask your health care provider about whether you are at high risk for HIV. Your health care provider may recommend a prescription medicine to help prevent HIV infection. If you choose to take medicine to prevent HIV, you should first get tested for HIV. You should then be tested every 3 months for as long as you are taking the medicine. Follow these instructions at  home: Lifestyle  Do not use any products that contain nicotine or tobacco, such as cigarettes, e-cigarettes, and chewing tobacco. If you need help quitting, ask your health care provider.  Do not use street drugs.  Do not share needles.  Ask your health care provider for help if you need support or information about quitting drugs. Alcohol use  Do not drink alcohol if your health care provider tells you not to drink.  If you drink alcohol: ? Limit how much you have to 0-2 drinks a day. ? Be aware of how much alcohol is in your drink. In the [place] one drink equals one 12 oz bottle of beer (355 mL), one 5 oz glass of wine (148 mL), or one 1 oz glass of hard liquor (44 mL). General instructions  Schedule regular health, dental, and eye exams.  Stay current with your vaccines.  Tell your health care provider  if: ? You often feel depressed. ? You have ever been abused or do not feel safe at home. Summary  Adopting a healthy lifestyle and getting preventive care are important in promoting health and wellness.  Follow your health care provider's instructions about healthy diet, exercising, and getting tested or screened for diseases.  Follow your health care provider's instructions on monitoring your cholesterol and blood pressure. This information is not intended to replace advice given to you by your health care provider. Make sure you discuss any questions you have with your health care provider. Document Revised: 11/20/2018 Document Reviewed: 11/20/2018 Elsevier Patient Education  2020 [employer].    Irving K. Rindi Beechy M.D.

## 2020-10-20 ENCOUNTER — Other Ambulatory Visit: Payer: Self-pay

## 2020-10-20 ENCOUNTER — Ambulatory Visit (INDEPENDENT_AMBULATORY_CARE_PROVIDER_SITE_OTHER): Payer: 59 | Admitting: Internal Medicine

## 2020-10-20 ENCOUNTER — Encounter: Payer: Self-pay | Admitting: Internal Medicine

## 2020-10-20 VITALS — BP 142/82 | HR 73 | Temp 97.9°F | Ht 68.0 in | Wt 228.4 lb

## 2020-10-20 DIAGNOSIS — Z79899 Other long term (current) drug therapy: Secondary | ICD-10-CM | POA: Diagnosis not present

## 2020-10-20 DIAGNOSIS — Z0000 Encounter for general adult medical examination without abnormal findings: Secondary | ICD-10-CM | POA: Diagnosis not present

## 2020-10-20 DIAGNOSIS — G2581 Restless legs syndrome: Secondary | ICD-10-CM | POA: Diagnosis not present

## 2020-10-20 DIAGNOSIS — Z8616 Personal history of COVID-19: Secondary | ICD-10-CM

## 2020-10-20 NOTE — Patient Instructions (Addendum)
Continue lifestyle intervention healthy eating and exercise .   Have  Your endocrinologist send [place]  Lab readings .   Let us know  if you want refill of  Requip.,     Health Maintenance, Male Adopting a healthy lifestyle and getting preventive care are important in promoting health and wellness. Ask your health care provider about:  The right schedule for you to have regular tests and exams.  Things you can do on your own to prevent diseases and keep yourself healthy. What should I know about diet, weight, and exercise? Eat a healthy diet   Eat a diet that includes plenty of vegetables, fruits, low-fat dairy products, and lean protein.  Do not eat a lot of foods that are high in solid fats, added sugars, or sodium. Maintain a healthy weight Body mass index (BMI) is a measurement that can be used to identify possible weight problems. It estimates body fat based on height and weight. Your health care provider can help determine your BMI and help you achieve or maintain a healthy weight. Get regular exercise Get regular exercise. This is one of the most important things you can do for your health. Most adults should:  Exercise for at least 150 minutes each week. The exercise should increase your heart rate and make you sweat (moderate-intensity exercise).  Do strengthening exercises at least twice a week. This is in addition to the moderate-intensity exercise.  Spend less time sitting. Even light physical activity can be beneficial. Watch cholesterol and blood lipids Have your blood tested for lipids and cholesterol at 30 years of age, then have this test every 5 years. You may need to have your cholesterol levels checked more often if:  Your lipid or cholesterol levels are high.  You are older than 30 years of age.  You are at high risk for heart disease. What should I know about cancer screening? Many types of cancers can be detected early and may often be prevented.  Depending on your health history and family history, you may need to have cancer screening at various ages. This may include screening for:  Colorectal cancer.  Prostate cancer.  Skin cancer.  Lung cancer. What should I know about heart disease, diabetes, and high blood pressure? Blood pressure and heart disease  High blood pressure causes heart disease and increases the risk of stroke. This is more likely to develop in people who have high blood pressure readings, are of African descent, or are overweight.  Talk with your health care provider about your target blood pressure readings.  Have your blood pressure checked: ? Every 3-5 years if you are 14-33 years of age. ? Every year if you are 8 years old or older.  If you are between the ages of 5 and 70 and are a current or former smoker, ask your health care provider if you should have a one-time screening for abdominal aortic aneurysm (AAA). Diabetes Have regular diabetes screenings. This checks your fasting blood sugar level. Have the screening done:  Once every three years after age 21 if you are at a normal weight and have a low risk for diabetes.  More often and at a younger age if you are overweight or have a high risk for diabetes. What should I know about preventing infection? Hepatitis B If you have a higher risk for hepatitis B, you should be screened for this virus. Talk with your health care provider to find out if you are at risk  for hepatitis B infection. Hepatitis C Blood testing is recommended for:  Everyone born from [birth year] through 1966.  Anyone with known risk factors for hepatitis C. Sexually transmitted infections (STIs)  You should be screened each year for STIs, including gonorrhea and chlamydia, if: ? You are sexually active and are younger than 30 years of age. ? You are older than 30 years of age and your health care provider tells you that you are at risk for this type of infection. ? Your sexual  activity has changed since you were last screened, and you are at increased risk for chlamydia or gonorrhea. Ask your health care provider if you are at risk.  Ask your health care provider about whether you are at high risk for HIV. Your health care provider may recommend a prescription medicine to help prevent HIV infection. If you choose to take medicine to prevent HIV, you should first get tested for HIV. You should then be tested every 3 months for as long as you are taking the medicine. Follow these instructions at home: Lifestyle  Do not use any products that contain nicotine or tobacco, such as cigarettes, e-cigarettes, and chewing tobacco. If you need help quitting, ask your health care provider.  Do not use street drugs.  Do not share needles.  Ask your health care provider for help if you need support or information about quitting drugs. Alcohol use  Do not drink alcohol if your health care provider tells you not to drink.  If you drink alcohol: ? Limit how much you have to 0-2 drinks a day. ? Be aware of how much alcohol is in your drink. In the [place] one drink equals one 12 oz bottle of beer (355 mL), one 5 oz glass of wine (148 mL), or one 1 oz glass of hard liquor (44 mL). General instructions  Schedule regular health, dental, and eye exams.  Stay current with your vaccines.  Tell your health care provider if: ? You often feel depressed. ? You have ever been abused or do not feel safe at home. Summary  Adopting a healthy lifestyle and getting preventive care are important in promoting health and wellness.  Follow your health care provider's instructions about healthy diet, exercising, and getting tested or screened for diseases.  Follow your health care provider's instructions on monitoring your cholesterol and blood pressure. This information is not intended to replace advice given to you by your health care provider. Make sure you discuss any questions you have  with your health care provider. Document Revised: 11/20/2018 Document Reviewed: 11/20/2018 Elsevier Patient Education  2020 [employer].

## 2020-10-21 ENCOUNTER — Other Ambulatory Visit: Payer: Self-pay | Admitting: Endocrinology

## 2022-09-11 ENCOUNTER — Ambulatory Visit: Payer: 59 | Attending: Obstetrics and Gynecology

## 2022-09-11 ENCOUNTER — Ambulatory Visit: Payer: 59 | Admitting: Sports Medicine

## 2022-09-11 ENCOUNTER — Ambulatory Visit (INDEPENDENT_AMBULATORY_CARE_PROVIDER_SITE_OTHER): Payer: 59

## 2022-09-11 VITALS — BP 126/80 | HR 91 | Ht 68.0 in | Wt 228.0 lb

## 2022-09-11 DIAGNOSIS — M533 Sacrococcygeal disorders, not elsewhere classified: Secondary | ICD-10-CM | POA: Diagnosis not present

## 2022-09-11 DIAGNOSIS — M545 Low back pain, unspecified: Secondary | ICD-10-CM | POA: Diagnosis not present

## 2022-09-11 MED ORDER — MELOXICAM 15 MG PO TABS: 15.0000 mg | ORAL_TABLET | Freq: Every day | ORAL | 0 refills | Status: AC

## 2022-09-11 NOTE — Patient Instructions (Signed)
Good to see you  - Start meloxicam 15 mg daily x2 weeks.  If still having pain after 2 weeks, complete 3rd-week of meloxicam. May use remaining meloxicam as needed once daily for pain control.  Do not to use additional NSAIDs while taking meloxicam.  May use Tylenol [phone] mg 2 to 3 times a day for breakthrough pain. Recommend getting a wedge pillow or donut pillow to sit on  As needed follow up if no improvement 3-4 week follow up

## 2022-09-11 NOTE — Progress Notes (Signed)
Aleen Sells D.[facility] [address] [place] [address] Phone: [phone]   Assessment and Plan:     1. Acute bilateral low back pain without sciatica 2. Coccydynia  -Acute, uncomplicated, initial sports medicine visit - Low back pain and tailbone pain after hard landing from skydiving on 09/08/2022 without acute fracture based on x-rays today - X-rays obtained in clinic.  My interpretation: No acute fracture or dislocation.  Large right L5 transverse process that articulates with sacrum.  This is a consistent finding with lumbar spine x-ray seen in 2020 - Start meloxicam 15 mg daily x2 weeks.  If still having pain after 2 weeks, complete 3rd-week of meloxicam. May use remaining meloxicam as needed once daily for pain control.  Do not to use additional NSAIDs while taking meloxicam.  May use Tylenol [phone] mg 2 to 3 times a day for breakthrough pain. - Recommend using wedge pillow/donut pillow for sitting  Pertinent previous records reviewed include lumbar x-ray 2020   Follow Up: As needed in 3 to 4 weeks if no improvement or worsening of symptoms   Subjective:   I, Moenique Parris, am serving as a Neurosurgeon for [employer]  Chief Complaint: low back and tailbone pain   HPI:   09/11/22 Patient is a 31 year old male complaining of low back and tailbone pain. Patient states that he went sky diving on Friday and had a hard landing, TTP no numbness or tingling, no radiating pain, feels it when he walks , sitting hurts the most the getting up and getting down, tylenol does seem to be helping,   Relevant Historical Information: DM type I  Additional pertinent review of systems negative.   Current Outpatient Medications:    ACCU-CHEK FASTCLIX LANCETS MISC, , Disp: , Rfl:    cetirizine (ZYRTEC) 10 MG tablet, Take 10 mg by mouth daily., Disp: , Rfl:    Diclofenac Sodium (PENNSAID) 2 % SOLN, Place 2 application onto the skin 2  (two) times daily., Disp: 112 g, Rfl: 3   Diclofenac Sodium 2 % SOLN, Place 2 g onto the skin 2 (two) times daily., Disp: 112 g, Rfl: 3   fluocinonide-emollient (LIDEX-E) 0.05 % cream, Apply 1 application topically 2 (two) times daily. For poison ivy, not on face, Disp: 30 g, Rfl: 1   gabapentin (NEURONTIN) 100 MG capsule, Take 2 capsules (200 mg total) by mouth at bedtime., Disp: 60 capsule, Rfl: 3   glucagon (GLUCAGON EMERGENCY) 1 MG injection, Inject 1 mg into the vein once as needed., Disp: 1 each, Rfl: 12   glucose blood (CONTOUR NEXT TEST) test strip, TEST 8 TIMES PER DAY, AND LANCETS 8/DAY, Disp: 240 each, Rfl: 11   HUMALOG KWIKPEN 100 UNIT/ML KwikPen, INJECT 0.2-0.3ML (20-30 UNITS TOTAL) INTO THE SKIN 3 TIMES DAILY WITHMEALS, Disp: 30 mL, Rfl: 1   ibuprofen (ADVIL,MOTRIN) 600 MG tablet, Take 1 tablet (600 mg total) by mouth every 6 (six) hours as needed., Disp: 30 tablet, Rfl: 0   Insulin Glargine (BASAGLAR KWIKPEN) 100 UNIT/ML SOPN, Inject 0.4 mLs (40 Units total) into the skin daily., Disp: 5 pen, Rfl: 5   Insulin Infusion Pump Supplies (INSULIN PUMP SYRINGE RESERVOIR) MISC, 1 Device by Does not apply route every 3 (three) days., Disp: 30 each, Rfl: 3   Insulin Infusion Pump Supplies (ULTRAFLEX) MISC, 1 Device by Does not apply route every 3 (three) days. 80 cm tube length, 8 mm catheter length, ref # [id number], Disp:  10 each, Rfl: 1   Melatonin 5 MG TABS, Take by mouth. PRN Sleep 1-2 tabs, Disp: , Rfl:    phentermine 37.5 MG capsule, Take 37.5 mg by mouth every morning., Disp: , Rfl:    rOPINIRole (REQUIP) 0.5 MG tablet, TAKE 1 TABLET BY MOUTH 1 TO 3 HOURS BEFORE BEDTIME--Please schedule a follow up visit for refills. [phone], Disp: 30 tablet, Rfl: 0   simvastatin (ZOCOR) 20 MG tablet, , Disp: , Rfl:    tiZANidine (ZANAFLEX) 4 MG tablet, TAKE 1 TABLET BY MOUTH EVERY NIGHT AT BEDTIME, Disp: 30 tablet, Rfl: 0   ULTICARE MICRO PEN NEEDLES 32G X 4 MM MISC, USE AS DIRECTED TO INJECT  INSULIN FOUR TIMES DAILY, Disp: 400 each, Rfl: 5   Vitamin D, Ergocalciferol, (DRISDOL) 1.25 MG (50000 UT) CAPS capsule, Take 1 capsule (50,000 Units total) by mouth every 7 (seven) days., Disp: 12 capsule, Rfl: 0   Vitamin D, Ergocalciferol, (DRISDOL) 1.25 MG (50000 UT) CAPS capsule, Take 1 capsule (50,000 Units total) by mouth every 7 (seven) days., Disp: 12 capsule, Rfl: 0   Objective:     Vitals:   09/11/22 0836  BP: 126/80  Pulse: 91  SpO2: 98%  Weight: 228 lb (103.4 kg)  Height: 5\' 8"  (1.727 m)      Body mass index is 34.67 kg/m.    Physical Exam:    Gen: Appears well, nad, nontoxic and pleasant Psych: Alert and oriented, appropriate mood and affect Neuro: sensation intact, strength is 5/5 in upper and lower extremities, muscle tone wnl Skin: no susupicious lesions or rashes  Back - Normal skin, Spine with normal alignment and no deformity.   No tenderness to vertebral process palpation.   Bilateral lumbar paraspinous muscles are mildly tender and without spasm TTP coccyx NTTP gluteal musculature Straight leg raise negative, though reproduced low back pain Gait normal   Electronically signed by:  Benito Mccreedy D.[facility] 8:56 AM 09/11/22

## 2022-10-11 ENCOUNTER — Other Ambulatory Visit: Payer: Self-pay | Admitting: Sports Medicine

## 2022-10-13 ENCOUNTER — Other Ambulatory Visit: Payer: Self-pay | Admitting: Genetics

## 2022-10-16 ENCOUNTER — Telehealth: Payer: Self-pay | Admitting: Genetics

## 2022-10-16 NOTE — Telephone Encounter (Signed)
Telephone note copied from reproductive partner's chart. Genetic counseling student, [facility], placed a telephone call to [employer] to discuss their expanded carrier screening results. Certified Genetic Counselor supervised the phone call. Both Andrew Ruiz and Levent were present on the call. The results from [place] and [place]'s carrier screening have been scanned into their charts and released to them in the [employer] online portal. 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 This information was previously known and discussed in detail during her genetic counseling appointment on 09/11/22. Jequan had negative ATM carrier screening. A negative result on carrier screening reduces the likelihood of being a carrier but does not entirely rule out the possibility. It is unlikely for the couple to have a child together affected with Ataxia Telangiectasia.   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 This information was previously known and discussed in detail during her genetic counseling appointment on 09/11/22. Masayuki had negative CFTR carrier screening. A negative result on carrier screening reduces the likelihood of being a carrier but does not entirely rule out the possibility. It is unlikely for the couple to have a child together affected with Cystic Fibrosis.  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 The zygosity of the variant is unknown. This means that Andrew Ruiz could be heterozygous (have one copy of the variant) and is a carrier. It could also mean that Andrew Ruiz could be homozygous (have two copies of the variant). If Andrew Ruiz has two copies of the variant, we would consider her to  be affected with non-classic congenital adrenal hyperplasia due to 21-hydroxylase deficiency. Females with this form of the condition are born with typical external genitalia. They may experience irregular menstruation, decreased fertility, excess hair growth on the face and body, and male-pattern baldness. Dyke had negative CYP21A2 carrier screening. A negative result on carrier screening reduces the likelihood of being a carrier but does not entirely rule out the possibility. It is unlikely for the couple to have a child together affected with Congenital Adrenal Hyperplasia due to 21-Hydroxylase Deficiency.  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 The variant identified in [name], called the Leiden variant, is associated with autosomal dominant thrombophilia due to activated protein C resistance, specifically to factor V Leiden Thrombophilia (FVL-T). Activated protein C resistance, including FVL-T, is a condition that causes an increased risk for blood clots. [name] reported to genetic counseling he has never had a blood clot and to his knowledge does not have a family history of blood clots. We reviewed that some people with FVL-T never develop abnormal clots. Given that Keyion has never had a blood clot the annual chance he may develop one is approximately less than 1% according to the available literature. We recommended that [name] share this information with his primary care provider for appropriate follow-up and management. The couple was counseled that each of [name]'s children have a 50% chance to inherit this variant and also have increased risk for blood clots. We discussed that Andrew Ruiz had negative F5 carrier screening. Therefore, it would be unlikely for the couple to have a child together with two F5 variants which would increase their blood clot risk even further.  [name] is a carrier for a single pathogenic variant (  c. .358C>T; p.Gln120*) in the  ALMS1 gene which causes Alstrom Syndrome. Andrew Ruiz had negative ALMS1 carrier screening. A negative result on carrier screening reduces the likelihood of being a carrier but does not entirely rule out the possibility. It is unlikely for the couple to have a child together affected with Alstrom syndrome.   [name] is a carrier for a single pathogenic variant (c.1330G>C; p.Asp444His) in the BTD gene which causes Biotinidase deficiency. Andrew Ruiz had negative BTD carrier screening. A negative result on carrier screening reduces the likelihood of being a carrier but does not entirely rule out the possibility. It is unlikely for the couple to have a child together affected with Biotinidase deficiency.   [name] is a carrier for a single pathogenic variant (c.2216G>A; p.Arg739His) in the [place] gene which causes Glycine encephalopathy. Andrew Ruiz had negative [place] carrier screening. A negative result on carrier screening reduces the likelihood of being a carrier but does not entirely rule out the possibility. It is unlikely for the couple to have a child together affected with Glycine encephalopathy.   [name] is a carrier for a single pathogenic (low penetrance) variant (c.187C>G; p.His63Asp) in the HFE gene which causes Hereditary Hemochromatosis Type 1. Andrew Ruiz had negative HFE carrier screening. A negative result on carrier screening reduces the likelihood of being a carrier but does not entirely rule out the possibility. It is unlikely for the couple to have a child together affected with Hereditary Hemochromatosis Type 1.   [name] is a carrier for a single pathogenic variant (c.4860dup; p.Lys1621Glufs*8) in the SPG11 gene which causes SPG11-related conditions. Andrew Ruiz had negative SPG11 carrier screening. A negative result on carrier screening reduces the likelihood of being a carrier but does not entirely rule out the possibility. It is unlikely for the couple to have a child together affected with SPG11-related  conditions.   Shandell is a carrier for a single pathogenic variant (Deletion of Exon 24) in the PEX1 gene which causes Zellweger spectrum disorder. Andrew Ruiz had negative PEX1 carrier screening. A negative result on carrier screening reduces the likelihood of being a carrier but does not entirely rule out the possibility. It is unlikely for the couple to have a child together affected with Zellweger spectrum disorder.   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 Specifically, both Andrew Ruiz and Maninder have the benign change (c.1685T>C; p.Ile562Thr). We discussed that pseudodeficiency alleles are not known to be associated with disease, including Krabbe disease. We discussed that individuals who inherit pseudodeficiency alleles may exhibit false positive results on newborn screening. The couple was counseled that Krabbe disease is not included in [place]'s newborn screening program.  Andrew Ruiz and Edwar each verbalized understanding of the information discussed and had no questions for genetic counseling. We encouraged them to reach out with questions if they have any in the future.
